# Patient Record
Sex: Male | Born: 1950 | Race: White | Hispanic: No | Marital: Single | Smoking: Never smoker
Health system: Southern US, Community
[De-identification: ages and names within clinical notes are randomized; demographics above are authoritative.]

## PROBLEM LIST (undated history)

## (undated) DIAGNOSIS — K759 Inflammatory liver disease, unspecified: Secondary | ICD-10-CM

## (undated) DIAGNOSIS — E785 Hyperlipidemia, unspecified: Secondary | ICD-10-CM

## (undated) DIAGNOSIS — N200 Calculus of kidney: Secondary | ICD-10-CM

## (undated) DIAGNOSIS — K219 Gastro-esophageal reflux disease without esophagitis: Secondary | ICD-10-CM

## (undated) DIAGNOSIS — K8020 Calculus of gallbladder without cholecystitis without obstruction: Secondary | ICD-10-CM

## (undated) DIAGNOSIS — N400 Enlarged prostate without lower urinary tract symptoms: Secondary | ICD-10-CM

## (undated) DIAGNOSIS — I442 Atrioventricular block, complete: Secondary | ICD-10-CM

## (undated) DIAGNOSIS — I480 Paroxysmal atrial fibrillation: Secondary | ICD-10-CM

## (undated) HISTORY — DX: Calculus of kidney: N20.0

## (undated) HISTORY — PX: LIVER BIOPSY: SHX301

## (undated) HISTORY — DX: Calculus of gallbladder without cholecystitis without obstruction: K80.20

## (undated) HISTORY — DX: Gastro-esophageal reflux disease without esophagitis: K21.9

## (undated) HISTORY — DX: Inflammatory liver disease, unspecified: K75.9

## (undated) HISTORY — PX: WISDOM TOOTH EXTRACTION: SHX21

## (undated) HISTORY — DX: Paroxysmal atrial fibrillation: I48.0

## (undated) HISTORY — DX: Hyperlipidemia, unspecified: E78.5

## (undated) HISTORY — PX: COLONOSCOPY: SHX174

## (undated) HISTORY — DX: Benign prostatic hyperplasia without lower urinary tract symptoms: N40.0

---

## 2010-01-24 ENCOUNTER — Ambulatory Visit (HOSPITAL_COMMUNITY): Admission: RE | Admit: 2010-01-24 | Discharge: 2010-01-24 | Payer: Self-pay | Admitting: Gastroenterology

## 2011-03-15 LAB — CBC
HCT: 45.6 % (ref 39.0–52.0)
Hemoglobin: 15.6 g/dL (ref 13.0–17.0)
WBC: 3 10*3/uL — ABNORMAL LOW (ref 4.0–10.5)

## 2012-10-02 ENCOUNTER — Other Ambulatory Visit: Payer: Self-pay | Admitting: Family Medicine

## 2012-10-02 DIAGNOSIS — H532 Diplopia: Secondary | ICD-10-CM

## 2012-10-03 ENCOUNTER — Ambulatory Visit
Admission: RE | Admit: 2012-10-03 | Discharge: 2012-10-03 | Disposition: A | Discharge status: Home / Self Care | Payer: BC Managed Care – PPO | Source: Ambulatory Visit | Attending: Family Medicine | Admitting: Family Medicine

## 2012-10-03 DIAGNOSIS — H532 Diplopia: Secondary | ICD-10-CM

## 2012-10-03 MED ORDER — GADOBENATE DIMEGLUMINE 529 MG/ML IV SOLN
19.0000 mL | Freq: Once | INTRAVENOUS | Status: AC | PRN
  Administered 2012-10-03: 19 mL via INTRAVENOUS

## 2014-06-14 ENCOUNTER — Ambulatory Visit
Admission: RE | Admit: 2014-06-14 | Discharge: 2014-06-14 | Disposition: A | Discharge status: Home / Self Care | Payer: BC Managed Care – PPO | Source: Ambulatory Visit | Attending: Family Medicine | Admitting: Family Medicine

## 2014-06-14 ENCOUNTER — Other Ambulatory Visit: Payer: Self-pay | Admitting: Family Medicine

## 2014-06-14 DIAGNOSIS — M545 Low back pain: Secondary | ICD-10-CM

## 2014-10-22 ENCOUNTER — Encounter: Payer: Self-pay | Admitting: Neurology

## 2014-10-22 ENCOUNTER — Ambulatory Visit (INDEPENDENT_AMBULATORY_CARE_PROVIDER_SITE_OTHER): Payer: BC Managed Care – PPO | Admitting: Neurology

## 2014-10-22 VITALS — BP 158/94 | HR 61 | Ht 71.0 in | Wt 196.5 lb

## 2014-10-22 DIAGNOSIS — H532 Diplopia: Secondary | ICD-10-CM

## 2014-10-22 DIAGNOSIS — G453 Amaurosis fugax: Secondary | ICD-10-CM

## 2014-10-22 DIAGNOSIS — H53133 Sudden visual loss, bilateral: Secondary | ICD-10-CM

## 2014-10-22 NOTE — Progress Notes (Signed)
[facility]    Provider:  Dr Jaynee Eagles Referring Provider: Hulan Fess, MD Primary Care Physician:  Gennette Pac, MD  CC:  Episodic blurry vision  HPI:  Frank Holloway is a 63 y.o. male here as a referral from Dr. Rex Kras for Vision Changes. Patient complains of acute vision changes that last last for a few minutes. Notes show that he had acute diplopia in 2013 and had an MRI of the brain at that time unfortunately MRI results are not available. Patient says MRI of the brain and cartotid arteries were normal at that . He reports bilateral wavy lines like he is looking through water and partially obscured vision above the middle horizontal line. lasted for 3 minutes. No headaches. No focal neurologic symptoms. He has migrating muscle pain, sharp but not debilitating for a second every once in a while for 10-15 minutes like a knot in the muscle. Vision changes happened twice in 48 hours. No confusion, no LOC, no CP or SOB. No problems chewing or pain in the temples. Right now back to baseline and vision is fine. No eye pain. No jaw claudication. No neck or back pain or stiffness.    Referring physician notes states patient with blurred vision/wavy lines.   Review of Systems: Patient complains of symptoms per HPI as well as the following symptoms: blurred vision and birth marks. Pertinent negatives per HPI. All others negative.   History   Social History  . Marital Status: Single    Spouse Name: N/A    Number of Children: N/A  . Years of Education: N/A   Occupational History  . car Designer, fashion/clothing in [place] Topics  . Smoking status: Never Smoker   . Smokeless tobacco: Never Used  . Alcohol Use: Yes     Comment: beer: 4 servings per week  . Drug Use: No  . Sexual Activity: Not on file   Other Topics Concern  . Not on file   Social History Narrative   Caffeine Use: daily    Family History  Problem Relation Age of Onset  .  Cirrhosis Father   . Breast cancer Mother   . Leukemia    . Heart attack    . Sarcoidosis    . Asthma Maternal Grandmother   . Sarcoidosis Sister     Past Medical History  Diagnosis Date  . GERD (gastroesophageal reflux disease)   . Gallstones   . Kidney stones   . Hepatitis     probable autoimmune  . Hyperlipidemia   . BPH (benign prostatic hyperplasia)     Past Surgical History  Procedure Laterality Date  . Wisdom tooth extraction    . Colonoscopy  2001, 2003  . Liver biopsy      Current Outpatient Prescriptions  Medication Sig Dispense Refill  . azaTHIOprine (IMURAN) 50 MG tablet Take 1 tablet by mouth daily.      . Calcium-Magnesium-Vitamin D (CALCIUM MAGNESIUM PO) Take 1 tablet by mouth daily.      . Multiple Vitamins-Minerals (MULTIVITAMIN PO) Take 1 tablet by mouth daily.      . pravastatin (PRAVACHOL) 10 MG tablet Take 1 tablet by mouth daily.       No current facility-administered medications for this visit.    Allergies as of 10/22/2014 - Review Complete 10/22/2014  Allergen Reaction Noted  . Lipitor [atorvastatin]  10/22/2014    Vitals: BP 158/94  Pulse 61  Ht 5' 11"  (1.803  m)  Wt 196 lb 8 oz (89.132 kg)  BMI 27.42 kg/m2 Last Weight:  Wt Readings from Last 1 Encounters:  10/22/14 196 lb 8 oz (89.132 kg)   Last Height:   Ht Readings from Last 1 Encounters:  10/22/14 5' 11"  (1.803 m)    Physical exam: Exam: Gen: NAD, conversant, well nourised, well groomed                     CV: RRR, no MRG. No Carotid Bruits. No peripheral edema, warm, nontender Eyes: Conjunctivae clear without exudates or hemorrhage  Neuro: Detailed Neurologic Exam  Speech:    Speech is normal; fluent and spontaneous with normal comprehension.  Cognition:    The patient is oriented to person, place, and time;     recent and remote memory intact;     language fluent;     normal attention, concentration,     fund of knowledge Cranial Nerves:    The pupils are equal,  round, and reactive to light. The fundi are normal and spontaneous venous pulsations are present. Visual fields are full to finger confrontation. Extraocular movements are intact. Trigeminal sensation is intact and the muscles of mastication are normal. The face is symmetric. The palate elevates in the midline. Voice is normal. Shoulder shrug is normal. The tongue has normal motion without fasciculations.   Coordination:    Normal finger to nose and heel to shin. Normal rapid alternating movements.   Gait:    Heel-toe and tandem gait are normal.   Motor Observation:    No asymmetry, no atrophy, and no involuntary movements noted. Tone:    Normal muscle tone.    Posture:    Posture is normal. normal erect    Strength:    Strength is V/V in the upper and lower limbs.      Sensation: intact     Reflex Exam:  DTR's:    Deep tendon reflexes in the upper and lower extremities are normal bilaterally.   Toes:    The toes are downgoing bilaterally.   Clonus:    Clonus is absent.      Assessment/Plan:  Patient with acute onset vision changes in the upper hemisphere of his vision. Transient binocular visual loss suggests a process involving the optic chiasm, tracts, or radiations, or the visual cortex. Need an MRI of the brain with/without contrast and an MRI of the orbits w/wo contrast. Also carotid dopplers to eval for carotid occlusive disease, as well as esr/crp to evaluate for temporal arteritis. Neuro exam non focal. Continue statin and asa for stroke prophylaxis. If workup is negative can consider migraine aura without the headache.    Sarina Ill, MD  [facility] [address] [place], [place] [address]  Phone [phone] Fax [phone]

## 2014-10-22 NOTE — Patient Instructions (Signed)
Overall you are doing fairly well but I do want to suggest a few things today:   Remember to drink plenty of fluid, eat healthy meals and do not skip any meals. Try to eat protein with a every meal and eat a healthy snack such as fruit or nuts in between meals. Try to keep a regular sleep-wake schedule and try to exercise daily, particularly in the form of walking, 20-30 minutes a day, if you can.   As far as diagnostic testing: MRI of the brain and orbits, carotid dopplers  I would like to see you back in 3 months, sooner if we need to. Please call us with any interim questions, concerns, problems, updates or refill requests.   Please also call us for any test results so we can go over those with you on the phone.  My clinical assistant and will answer any of your questions and relay your messages to me and also relay most of my messages to you.   Our phone number is [phone]. We also have an after hours call service for urgent matters and there is a physician on-call for urgent questions. For any emergencies you know to call 911 or go to the nearest emergency room

## 2014-10-23 LAB — BASIC METABOLIC PANEL
BUN / CREAT RATIO: 15 (ref 10–22)
BUN: 15 mg/dL (ref 8–27)
CALCIUM: 9.5 mg/dL (ref 8.6–10.2)
CHLORIDE: 102 mmol/L (ref 97–108)
CO2: 27 mmol/L (ref 18–29)
Creatinine, Ser: 1.01 mg/dL (ref 0.76–1.27)
GFR calc non Af Amer: 79 mL/min/{1.73_m2} (ref >59)
GFR, EST AFRICAN AMERICAN: 91 mL/min/{1.73_m2} (ref >59)
Glucose: 93 mg/dL (ref 65–99)
POTASSIUM: 4.7 mmol/L (ref 3.5–5.2)
Sodium: 143 mmol/L (ref 134–144)

## 2014-10-23 LAB — SEDIMENTATION RATE: SED RATE: 2 mm/h (ref 0–30)

## 2014-10-23 LAB — C-REACTIVE PROTEIN: CRP: 0.4 mg/L (ref 0.0–4.9)

## 2014-10-27 ENCOUNTER — Telehealth: Payer: Self-pay

## 2014-10-27 NOTE — Telephone Encounter (Signed)
-----   Message from Anson FretAntonia B Ahern, MD sent at 10/25/2014 11:20 AM EST ----- Please let patient know that his labs were normal. Thank you

## 2014-10-27 NOTE — Telephone Encounter (Signed)
Called patient. Gave results

## 2014-11-01 ENCOUNTER — Telehealth: Payer: Self-pay | Admitting: Radiology

## 2014-11-21 ENCOUNTER — Encounter: Payer: Self-pay | Admitting: *Deleted

## 2015-03-23 ENCOUNTER — Other Ambulatory Visit: Payer: Self-pay | Admitting: Family Medicine

## 2015-03-23 DIAGNOSIS — I739 Peripheral vascular disease, unspecified: Principal | ICD-10-CM

## 2015-03-23 DIAGNOSIS — I779 Disorder of arteries and arterioles, unspecified: Secondary | ICD-10-CM

## 2015-04-05 ENCOUNTER — Ambulatory Visit
Admission: RE | Admit: 2015-04-05 | Discharge: 2015-04-05 | Disposition: A | Discharge status: Home / Self Care | Payer: Self-pay | Source: Ambulatory Visit | Attending: Family Medicine | Admitting: Family Medicine

## 2015-04-05 DIAGNOSIS — I779 Disorder of arteries and arterioles, unspecified: Secondary | ICD-10-CM

## 2015-04-05 DIAGNOSIS — I739 Peripheral vascular disease, unspecified: Principal | ICD-10-CM

## 2017-04-23 ENCOUNTER — Emergency Department (HOSPITAL_COMMUNITY): Payer: BLUE CROSS/BLUE SHIELD

## 2017-04-23 ENCOUNTER — Encounter (HOSPITAL_COMMUNITY): Payer: Self-pay | Admitting: Emergency Medicine

## 2017-04-23 ENCOUNTER — Inpatient Hospital Stay (HOSPITAL_COMMUNITY)
Admission: EM | Admit: 2017-04-23 | Discharge: 2017-04-26 | DRG: 243 | Disposition: A | Discharge status: Home / Self Care | Payer: BLUE CROSS/BLUE SHIELD | Attending: Cardiovascular Disease | Admitting: Cardiovascular Disease

## 2017-04-23 DIAGNOSIS — I2510 Atherosclerotic heart disease of native coronary artery without angina pectoris: Secondary | ICD-10-CM | POA: Diagnosis present

## 2017-04-23 DIAGNOSIS — K754 Autoimmune hepatitis: Secondary | ICD-10-CM | POA: Diagnosis present

## 2017-04-23 DIAGNOSIS — I214 Non-ST elevation (NSTEMI) myocardial infarction: Secondary | ICD-10-CM | POA: Diagnosis not present

## 2017-04-23 DIAGNOSIS — I441 Atrioventricular block, second degree: Secondary | ICD-10-CM

## 2017-04-23 DIAGNOSIS — Z806 Family history of leukemia: Secondary | ICD-10-CM | POA: Diagnosis not present

## 2017-04-23 DIAGNOSIS — E785 Hyperlipidemia, unspecified: Secondary | ICD-10-CM | POA: Diagnosis present

## 2017-04-23 DIAGNOSIS — K219 Gastro-esophageal reflux disease without esophagitis: Secondary | ICD-10-CM | POA: Diagnosis present

## 2017-04-23 DIAGNOSIS — I10 Essential (primary) hypertension: Secondary | ICD-10-CM | POA: Insufficient documentation

## 2017-04-23 DIAGNOSIS — R55 Syncope and collapse: Secondary | ICD-10-CM | POA: Diagnosis present

## 2017-04-23 DIAGNOSIS — R7989 Other specified abnormal findings of blood chemistry: Secondary | ICD-10-CM

## 2017-04-23 DIAGNOSIS — Z8249 Family history of ischemic heart disease and other diseases of the circulatory system: Secondary | ICD-10-CM | POA: Diagnosis not present

## 2017-04-23 DIAGNOSIS — I447 Left bundle-branch block, unspecified: Secondary | ICD-10-CM | POA: Diagnosis present

## 2017-04-23 DIAGNOSIS — I442 Atrioventricular block, complete: Secondary | ICD-10-CM | POA: Diagnosis not present

## 2017-04-23 DIAGNOSIS — Z87898 Personal history of other specified conditions: Secondary | ICD-10-CM

## 2017-04-23 DIAGNOSIS — I4892 Unspecified atrial flutter: Secondary | ICD-10-CM | POA: Diagnosis not present

## 2017-04-23 DIAGNOSIS — Z959 Presence of cardiac and vascular implant and graft, unspecified: Secondary | ICD-10-CM

## 2017-04-23 DIAGNOSIS — N400 Enlarged prostate without lower urinary tract symptoms: Secondary | ICD-10-CM | POA: Diagnosis present

## 2017-04-23 DIAGNOSIS — I21A1 Myocardial infarction type 2: Secondary | ICD-10-CM | POA: Diagnosis present

## 2017-04-23 DIAGNOSIS — R778 Other specified abnormalities of plasma proteins: Secondary | ICD-10-CM

## 2017-04-23 HISTORY — DX: Atrioventricular block, complete: I44.2

## 2017-04-23 LAB — I-STAT TROPONIN, ED
Troponin i, poc: 0.02 ng/mL (ref 0.00–0.08)
Troponin i, poc: 0.17 ng/mL (ref 0.00–0.08)

## 2017-04-23 LAB — BASIC METABOLIC PANEL
ANION GAP: 8 (ref 5–15)
BUN: 15 mg/dL (ref 6–20)
CHLORIDE: 105 mmol/L (ref 101–111)
CO2: 26 mmol/L (ref 22–32)
Calcium: 9.6 mg/dL (ref 8.9–10.3)
Creatinine, Ser: 0.91 mg/dL (ref 0.61–1.24)
GFR calc Af Amer: >60 mL/min (ref >60)
GLUCOSE: 106 mg/dL — AB (ref 65–99)
POTASSIUM: 4.2 mmol/L (ref 3.5–5.1)
SODIUM: 139 mmol/L (ref 135–145)

## 2017-04-23 LAB — TROPONIN I: Troponin I: 0.25 ng/mL (ref <0.03)

## 2017-04-23 LAB — CBC
HEMATOCRIT: 43.5 % (ref 39.0–52.0)
HEMOGLOBIN: 14.7 g/dL (ref 13.0–17.0)
MCH: 31.3 pg (ref 26.0–34.0)
MCHC: 33.8 g/dL (ref 30.0–36.0)
MCV: 92.6 fL (ref 78.0–100.0)
Platelets: 148 10*3/uL — ABNORMAL LOW (ref 150–400)
RBC: 4.7 MIL/uL (ref 4.22–5.81)
RDW: 12.7 % (ref 11.5–15.5)
WBC: 4.2 10*3/uL (ref 4.0–10.5)

## 2017-04-23 LAB — MAGNESIUM: Magnesium: 2.1 mg/dL (ref 1.7–2.4)

## 2017-04-23 LAB — TSH: TSH: 1.037 u[IU]/mL (ref 0.350–4.500)

## 2017-04-23 LAB — BRAIN NATRIURETIC PEPTIDE: B NATRIURETIC PEPTIDE 5: 17.7 pg/mL (ref 0.0–100.0)

## 2017-04-23 LAB — HEPARIN LEVEL (UNFRACTIONATED): Heparin Unfractionated: 0.4 IU/mL (ref 0.30–0.70)

## 2017-04-23 MED ORDER — MULTIVITAMIN PO LIQD: Freq: Every day | ORAL | Status: DC

## 2017-04-23 MED ORDER — PRAVASTATIN SODIUM 20 MG PO TABS
10.0000 mg | ORAL_TABLET | Freq: Every day | ORAL | Status: DC
  Administered 2017-04-24 – 2017-04-26 (×3): 10 mg via ORAL
  Filled 2017-04-23 (×3): qty 1

## 2017-04-23 MED ORDER — ACETAMINOPHEN 325 MG PO TABS: 650.0000 mg | ORAL_TABLET | ORAL | Status: DC | PRN

## 2017-04-23 MED ORDER — ALPRAZOLAM 0.25 MG PO TABS: 0.2500 mg | ORAL_TABLET | Freq: Two times a day (BID) | ORAL | Status: DC | PRN

## 2017-04-23 MED ORDER — SODIUM CHLORIDE 0.9 % IV SOLN: 250.0000 mL | INTRAVENOUS | Status: DC | PRN

## 2017-04-23 MED ORDER — HEPARIN (PORCINE) IN NACL 100-0.45 UNIT/ML-% IJ SOLN
1100.0000 [IU]/h | INTRAMUSCULAR | Status: DC
  Administered 2017-04-23: 1550 [IU]/h via INTRAVENOUS
  Filled 2017-04-23: qty 250

## 2017-04-23 MED ORDER — AZATHIOPRINE 50 MG PO TABS
50.0000 mg | ORAL_TABLET | Freq: Every day | ORAL | Status: DC
  Administered 2017-04-24 – 2017-04-26 (×3): 50 mg via ORAL
  Filled 2017-04-23 (×3): qty 1

## 2017-04-23 MED ORDER — ADULT MULTIVITAMIN LIQUID CH
15.0000 mL | Freq: Every day | ORAL | Status: DC
  Administered 2017-04-24 – 2017-04-25 (×2): 15 mL via ORAL
  Filled 2017-04-23 (×2): qty 15

## 2017-04-23 MED ORDER — SODIUM CHLORIDE 0.9% FLUSH: 3.0000 mL | INTRAVENOUS | Status: DC | PRN

## 2017-04-23 MED ORDER — NITROGLYCERIN 0.4 MG SL SUBL: 0.4000 mg | SUBLINGUAL_TABLET | SUBLINGUAL | Status: DC | PRN

## 2017-04-23 MED ORDER — IOPAMIDOL (ISOVUE-370) INJECTION 76%
INTRAVENOUS | Status: AC
  Administered 2017-04-23: 100 mL
  Filled 2017-04-23: qty 100

## 2017-04-23 MED ORDER — ASPIRIN EC 81 MG PO TBEC
81.0000 mg | DELAYED_RELEASE_TABLET | Freq: Every day | ORAL | Status: DC
  Administered 2017-04-24: 81 mg via ORAL
  Filled 2017-04-23: qty 1

## 2017-04-23 MED ORDER — HEPARIN BOLUS VIA INFUSION
4000.0000 [IU] | Freq: Once | INTRAVENOUS | Status: DC
  Filled 2017-04-23: qty 4000

## 2017-04-23 MED ORDER — CARVEDILOL 3.125 MG PO TABS
3.1250 mg | ORAL_TABLET | Freq: Two times a day (BID) | ORAL | Status: DC
  Administered 2017-04-23 – 2017-04-24 (×2): 3.125 mg via ORAL
  Filled 2017-04-23 (×3): qty 1

## 2017-04-23 MED ORDER — ONDANSETRON HCL 4 MG/2ML IJ SOLN: 4.0000 mg | Freq: Four times a day (QID) | INTRAMUSCULAR | Status: DC | PRN

## 2017-04-23 MED ORDER — HEPARIN BOLUS VIA INFUSION
5000.0000 [IU] | Freq: Once | INTRAVENOUS | Status: AC
  Administered 2017-04-23: 5000 [IU] via INTRAVENOUS
  Filled 2017-04-23: qty 5000

## 2017-04-23 MED ORDER — SODIUM CHLORIDE 0.9 % WEIGHT BASED INFUSION
1.0000 mL/kg/h | INTRAVENOUS | Status: DC
  Administered 2017-04-23: 1 mL/kg/h via INTRAVENOUS

## 2017-04-23 MED ORDER — ASPIRIN 81 MG PO CHEW
324.0000 mg | CHEWABLE_TABLET | ORAL | Status: AC
  Administered 2017-04-23: 324 mg via ORAL
  Filled 2017-04-23: qty 4

## 2017-04-23 MED ORDER — ASPIRIN 300 MG RE SUPP: 300.0000 mg | RECTAL | Status: DC

## 2017-04-23 MED ORDER — ZOLPIDEM TARTRATE 5 MG PO TABS: 5.0000 mg | ORAL_TABLET | Freq: Every evening | ORAL | Status: DC | PRN

## 2017-04-23 NOTE — ED Notes (Signed)
Admitting provider at bedside.

## 2017-04-23 NOTE — ED Triage Notes (Signed)
Pt coming from work in [place] via [facility], after a unwitnessed fall. Patient was seen normal this morning and then found on the floor unresponsive. When EMS arrived patient was confused and couldn't tell what happened or who the president was. Stroke scale initially with EMS was 0. After EMS arrival patient began to become alert. Patient is alert and oriented x4 on arrival to this ED. Patient has posterior hematoma from fall. EMS reports left bbb, and pvcs.

## 2017-04-23 NOTE — ED Notes (Signed)
Urine sample obtained.  Order then cancelled by EDP.

## 2017-04-23 NOTE — H&P (Signed)
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 He ios followed by Dr Wyline Beady. He has no history of CAD or prior cardiac issues. He had been going to the gym regularly til 4-5 months ago, he stopped because of other commitments. Today he was getting ready for work. He had been sitting for less than a minute. Got up and the next thing he knew EMTs were there. He had no pre syncopal warning- no palpitations, no nausea, no dizziness. He does admit to some recent exertional SOB and chest pressure when carrying groceries for the past 3-4 weeks. In the ED his EKG shows NSR, LBBB, PVC. Troponin #1 negative, #2 - 0.17. Chest CT shows Ca++ coronaries. ,   Problem List: Past Medical History:  Diagnosis Date  . BPH (benign prostatic hyperplasia)   . Gallstones   . GERD (gastroesophageal reflux disease)   . 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 Hyperlipidemia   . Kidney stones     Past Surgical History:  Procedure Laterality Date  . COLONOSCOPY  2001, 2003  . LIVER BIOPSY    . WISDOM TOOTH EXTRACTION       Allergies:  Allergies  Allergen Reactions  . Lipitor [Atorvastatin]     Elevated liver function     Home Medications  Past Medical History:  Diagnosis Date  . BPH (benign prostatic hyperplasia)   . Gallstones   . GERD (gastroesophageal reflux disease)   . 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 Hyperlipidemia   . Kidney stones     Prior to Admission medications   Medication Sig Start Date End Date Taking? Authorizing Provider  azaTHIOprine (IMURAN) 50 MG tablet Take 1 tablet by mouth daily. 09/28/14  Yes Historical Provider, MD  Calcium-Magnesium-Vitamin D (CALCIUM MAGNESIUM PO) Take 1 tablet by mouth daily.   Yes Historical Provider, MD  milk thistle 175 MG tablet Take 175 mg by mouth  daily.   Yes Historical Provider, MD  Multiple Vitamins-Minerals (MULTIVITAMIN PO) Take 1 tablet by mouth daily.   Yes Historical Provider, MD  pravastatin (PRAVACHOL) 10 MG tablet Take 1 tablet by mouth daily. 10/13/14  Yes Historical Provider, MD     Family History  Problem Relation Age of Onset  . Cirrhosis Father   . Breast cancer Mother   . Sarcoidosis Sister   . Leukemia    . Heart attack    . Sarcoidosis    . Asthma Maternal Grandmother      Social History   Social History  . Marital status: Single    Spouse name: N/A  . Number of children: N/A  . Years of education: N/A   Occupational History  . 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 Smoking status: Never Smoker  . Smokeless tobacco: Never Used  . Alcohol use Yes     Comment: beer: 4 servings per week  . Drug use: No  . Sexual activity: Not on file   Other Topics Concern  . Not on file   Social History Narrative   Caffeine Use: daily     Review of Systems: General: negative for chills, fever, night sweats or weight changes.  Cardiovascular: negative for edema, orthopnea, palpitations, paroxysmal nocturnal dyspnea or shortness  of breath HEENT: negative for any visual disturbances, blindness, glaucoma Dermatological: negative for rash Respiratory: negative for cough, hemoptysis, or wheezing Urologic: negative for hematuria or dysuria Abdominal: negative for nausea, vomiting, diarrhea, bright red blood per rectum, melena, or hematemesis Neurologic: negative for visual changes, syncope, or dizziness Musculoskeletal: negative for back pain, joint pain, or swelling Psych: cooperative and appropriate Minor carotid disease on dopplers 2016, negative MRI in 2013 All other systems reviewed and are otherwise negative except as noted above.  Physical Exam: Blood pressure (!) 152/102, pulse 74, temperature 98.6 F (37 C), temperature source Oral,  resp. rate 15, height  (1.803 m), weight 203 lb (92.1 kg), SpO2 97 %.  General appearance: alert, cooperative and no distress Neck: no carotid bruit and no JVD Lungs: clear to auscultation bilaterally Heart: regular rate and rhythm Abdomen: soft, non-tender; bowel sounds normal; no masses,  no organomegaly Extremities: extremities normal, atraumatic, no cyanosis or edema Pulses: 2+ and symmetric Skin: Skin color, texture, turgor normal. No rashes or lesions Neurologic: Grossly normal    Labs:   Results for orders placed or performed during the hospital encounter of 04/23/17 (from the past 24 hour(s))  Basic metabolic panel     Status: Abnormal   Collection Time: 04/23/17 12:04 PM  Result Value Ref Range   Sodium 139 135 - 145 mmol/L   Potassium 4.2 3.5 - 5.1 mmol/L   Chloride 105 101 - 111 mmol/L   CO2 26 22 - 32 mmol/L   Glucose, Bld 106 (H) 65 - 99 mg/dL   BUN 15 6 - 20 mg/dL   Creatinine, Ser 1.61 0.61 - 1.24 mg/dL   Calcium 9.6 8.9 - 09.6 mg/dL   GFR calc non Af Amer >60 >60 mL/min   GFR calc Af Amer >60 >60 mL/min   Anion gap 8 5 - 15  CBC     Status: Abnormal   Collection Time: 04/23/17 12:04 PM  Result Value Ref Range   WBC 4.2 4.0 - 10.5 K/uL   RBC 4.70 4.22 - 5.81 MIL/uL   Hemoglobin 14.7 13.0 - 17.0 g/dL   HCT 04.5 40.9 - 81.1 %   MCV 92.6 78.0 - 100.0 fL   MCH 31.3 26.0 - 34.0 pg   MCHC 33.8 30.0 - 36.0 g/dL   RDW 91.4 78.2 - 95.6 %   Platelets 148 (L) 150 - 400 K/uL  Brain natriuretic peptide     Status: None   Collection Time: 04/23/17 12:04 PM  Result Value Ref Range   B Natriuretic Peptide 17.7 0.0 - 100.0 pg/mL  I-Stat Troponin, ED (not at [facility])     Status: None   Collection Time: 04/23/17 12:57 PM  Result Value Ref Range   Troponin i, poc 0.02 0.00 - 0.08 ng/mL   Comment 3          I-Stat Troponin, ED (not at [facility])     Status: Abnormal   Collection Time: 04/23/17  3:53 PM  Result Value Ref Range   Troponin i, poc 0.17 (HH) 0.00 - 0.08 ng/mL     Comment NOTIFIED PHYSICIAN    Comment 3             Radiology/Studies: Ct Head Wo Contrast  Result Date: 04/23/2017 CLINICAL DATA:  Unwitnessed fall.  Initial encounter. EXAM: CT HEAD WITHOUT CONTRAST TECHNIQUE: Contiguous axial images were obtained from the base of the skull through the vertex without intravenous contrast. COMPARISON:  None. FINDINGS: Brain: No swelling or acute  hemorrhage suspected. Minimal thickened appearance of the inferior septum pellucidum on 3:16 is suspected volume averaging. No discrete hemorrhage at this site on reformats. No infarct, hydrocephalus, or mass. Vascular: Negative Skull: Scalp contusion at the vertex. Negative for underlying fracture Sinuses/Orbits: Mild mucosal thickening in the paranasal sinuses. No hemosinus. IMPRESSION: Scalp contusion at the vertex without fracture. No acute intracranial finding. Electronically Signed   By: Marnee Spring M.D.   On: 04/23/2017 14:27   Ct Angio Chest Pe W And/or Wo Contrast  Result Date: 04/23/2017 CLINICAL DATA:  Syncopal episode today at work. EXAM: CT ANGIOGRAPHY CHEST WITH CONTRAST TECHNIQUE: Multidetector CT imaging of the chest was performed using the standard protocol during bolus administration of intravenous contrast. Multiplanar CT image reconstructions and MIPs were obtained to evaluate the vascular anatomy. CONTRAST:  100 cc of Isovue 370 COMPARISON:  Chest radiograph 02/07/2011. FINDINGS: Cardiovascular: The quality of this exam for evaluation of pulmonary embolism is good. No evidence of pulmonary embolism. Aortic atherosclerosis. Mild cardiomegaly. Multivessel coronary artery atherosclerosis. Mediastinum/Nodes: No mediastinal or hilar adenopathy. Lungs/Pleura: No pleural fluid.  Mild lingular volume loss. Upper Abdomen: Gallstones of up to 13 mm, without surrounding inflammation. Normal imaged portions of the liver, spleen, stomach, pancreas, adrenal glands, kidneys. Musculoskeletal: Hypoattenuating upper  thoracic vertebral body lesions are likely hemangiomas and are tiny. Largest is in the T6 vertebral body, 8 mm. Review of the MIP images confirms the above findings. IMPRESSION: 1.  No evidence of pulmonary embolism. 2.  Coronary artery atherosclerosis. Aortic atherosclerosis. 3. Cholelithiasis. Electronically Signed   By: Jeronimo Greaves M.D.   On: 04/23/2017 17:03    EKG:NSR, LBBB, PVC  ASSESSMENT AND PLAN:  Principal Problem:   Syncope and collapse Active Problems:   History of exertional chest pain   LBBB (left bundle branch block)   Autoimmune hepatitis (HCC)   PLAN: Admit, r/o MI. He probably needs cath in AM. Start Heparin, ASA, and Coreg-hold off on statin with autoimmune hepatitis.    Jolene Provost, PA-C 04/23/2017, 5:42 PM [phone]

## 2017-04-23 NOTE — ED Notes (Signed)
Patient transported to CT 

## 2017-04-23 NOTE — Progress Notes (Addendum)
ANTICOAGULATION CONSULT NOTE - Initial Consult  Pharmacy Consult for heparin Indication: PE  Allergies  Allergen Reactions  . Lipitor [Atorvastatin]     Elevated liver function    Patient Measurements: Height:  (180.3 cm) Weight: 203 lb (92.1 kg) IBW/kg (Calculated) : 75.3 Heparin Dosing Weight: 92.1kg  Vital Signs: Temp: 98.6 F (37 C) (05/01 1202) Temp Source: Oral (05/01 1202) BP: 152/102 (05/01 1530) Pulse Rate: 74 (05/01 1530)  Labs:  Recent Labs  04/23/17 1204  HGB 14.7  HCT 43.5  PLT 148*  CREATININE 0.91    Estimated Creatinine Clearance: 92.6 mL/min (by C-G formula based on SCr of 0.91 mg/dL).   Medical History: Past Medical History:  Diagnosis Date  . BPH (benign prostatic hyperplasia)   . Gallstones   . GERD (gastroesophageal reflux disease)   . 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 Hyperlipidemia   . Kidney stones     Assessment: 66yo M admitted with unwitnessed fall. Pharmacy has been consulted to dose heparin for concern for PE. No PTA anticoagulation, Hgb 14.7, Plt 148. Of note, patient with posterior hematoma from fall.  Goal of Therapy:  Heparin level 0.3-0.7 units/ml Monitor platelets by anticoagulation protocol: Yes   Plan:  Give 5000 units bolus x 1 Start heparin infusion at 1550 units/hr Check anti-Xa level in 6 hours and daily while on heparin Continue to monitor H&H and platelets    Mackie Pai, PharmD PGY1 Pharmacy Resident Pager: [phone] 04/23/2017 4:29 PM   Addendum: CT scan reported as negative for PE. Will reduce heparin gtt rate to ACS dosing at 1100 units/hr  Lysle Pearl, PharmD, BCPS Pager # [phone] 04/23/2017 5:12 PM

## 2017-04-23 NOTE — ED Notes (Signed)
Ice pack applied to back of head. Pt provided with [place] sandwich and water

## 2017-04-23 NOTE — ED Provider Notes (Addendum)
[place] DEPT Provider Note   CSN: [id number] Arrival date & time: 04/23/17  1148     History   Chief Complaint Chief Complaint  Patient presents with  . Loss of Consciousness    HPI Frank Holloway is a 66 y.o. male.  The history is provided by the patient.  Loss of Consciousness   This is a new problem. The current episode started 1 to 2 hours ago. Episode frequency: once. The problem has been resolved. Length of episode of loss of consciousness: unknown. The problem is associated with standing up. Associated symptoms include headaches and light-headedness. Pertinent negatives include abdominal pain, bladder incontinence, chest pain, confusion, congestion, diaphoresis, fever, malaise/fatigue, nausea, seizures and vomiting. He has tried nothing for the symptoms. His past medical history is significant for TIA.   Reports mild light headedness this am with standing up. Mild headache today that resolved prior to syncope.  DOE for 1 week while carrying groceries; otherwise no SOB.     Past Medical History:  Diagnosis Date  . BPH (benign prostatic hyperplasia)   . Gallstones   . GERD (gastroesophageal reflux disease)   . 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 Hyperlipidemia   . Kidney stones     Patient Active Problem List   Diagnosis Date Noted  . 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 COLONOSCOPY  2001, 2003  . LIVER BIOPSY    . WISDOM TOOTH EXTRACTION         Home Medications    Prior to Admission medications   Medication Sig Start Date End Date Taking? Authorizing Provider  azaTHIOprine (IMURAN) 50 MG tablet Take 1 tablet by mouth daily. 09/28/14  Yes Historical Provider, MD  Calcium-Magnesium-Vitamin D (CALCIUM MAGNESIUM PO) Take 1 tablet by mouth daily.   Yes Historical Provider, MD  milk thistle 175 MG tablet Take 175 mg by mouth daily.   Yes Historical Provider, MD  Multiple Vitamins-Minerals (MULTIVITAMIN PO)  Take 1 tablet by mouth daily.   Yes Historical Provider, MD  pravastatin (PRAVACHOL) 10 MG tablet Take 1 tablet by mouth daily. 10/13/14  Yes Historical Provider, MD    Family History Family History  Problem Relation Age of Onset  . Cirrhosis Father   . Breast cancer Mother   . Sarcoidosis Sister   . Leukemia    . Heart attack    . Sarcoidosis    . Asthma Maternal Grandmother     Social History Social History  Substance Use Topics  . Smoking status: Never Smoker  . Smokeless tobacco: Never Used  . Alcohol use Yes     Comment: beer: 4 servings per week     Allergies   Lipitor [atorvastatin]   Review of Systems Review of Systems  Constitutional: Negative for diaphoresis, fever and malaise/fatigue.  HENT: Negative for congestion.   Cardiovascular: Positive for syncope. Negative for chest pain.  Gastrointestinal: Negative for abdominal pain, nausea and vomiting.  Genitourinary: Negative for bladder incontinence.  Neurological: Positive for light-headedness and headaches. Negative for seizures.  Psychiatric/Behavioral: Negative for confusion.   All other systems are reviewed and are negative for acute change except as noted in the HPI   Physical Exam Updated Vital Signs BP (!) 170/92 (BP Location: Right Arm)   Pulse 70   Temp 98.6 F (37 C) (Oral)   Resp 16   Ht 5' 11"  (1.803 m)   Wt 203 lb (92.1 kg)   SpO2 100%  BMI 28.31 kg/m   Physical Exam  Constitutional: He is oriented to person, place, and time. He appears well-developed and well-nourished. No distress.  HENT:  Head: Normocephalic and atraumatic.  Nose: Nose normal.  Eyes: Conjunctivae and EOM are normal. Pupils are equal, round, and reactive to light. Right eye exhibits no discharge. Left eye exhibits no discharge. No scleral icterus.  Neck: Normal range of motion. Neck supple.  Cardiovascular: Normal rate and regular rhythm.  Exam reveals no gallop and no friction rub.   No murmur  heard. Pulmonary/Chest: Effort normal and breath sounds normal. No stridor. No respiratory distress. He has no rales.  Abdominal: Soft. He exhibits no distension. There is no tenderness.  Musculoskeletal: He exhibits no edema or tenderness.  Neurological: He is alert and oriented to person, place, and time.  Mental Status: Alert and oriented to person, place, and time. Attention and concentration normal. Speech clear. Recent memory is intact  Cranial Nerves  II Visual Fields: Intact to confrontation. Visual fields intact. III, IV, VI: Pupils equal and reactive to light and near. Full eye movement without nystagmus  V Facial Sensation: Normal. No weakness of masticatory muscles  VII: No facial weakness or asymmetry  VIII Auditory Acuity: Grossly normal  IX/X: The uvula is midline; the palate elevates symmetrically  XI: Normal sternocleidomastoid and trapezius strength  XII: The tongue is midline. No atrophy or fasciculations.   Motor System: Muscle Strength: 5/5 and symmetric in the upper and lower extremities. No pronation or drift.  Muscle Tone: Tone and muscle bulk are normal in the upper and lower extremities.   Reflexes: DTRs: 2+ and symmetrical in all four extremities. Plantar responses are flexor bilaterally.  Coordination: Intact finger-to-nose, heel-to-shin, No tremor.  Sensation: Intact to light touch, and pinprick. Negative Romberg test.  Gait: Routine gait normal    Skin: Skin is warm and dry. No rash noted. He is not diaphoretic. No erythema.  Psychiatric: He has a normal mood and affect.  Vitals reviewed.    ED Treatments / Results  Labs (all labs ordered are listed, but only abnormal results are displayed) Labs Reviewed  BASIC METABOLIC PANEL - Abnormal; Notable for the following:       Result Value   Glucose, Bld 106 (*)    All other components within normal limits  CBC - Abnormal; Notable for the following:    Platelets 148 (*)    All other components within  normal limits  BRAIN NATRIURETIC PEPTIDE  I-STAT TROPOININ, ED  I-STAT TROPOININ, ED    EKG  EKG Interpretation  Date/Time:  Tuesday Apr 23 2017 11:55:14 EDT Ventricular Rate:  68 PR Interval:    QRS Duration: 158 QT Interval:  406 QTC Calculation: 432 R Axis:   -19 Text Interpretation:  Sinus rhythm Ventricular premature complex Left bundle branch block NO STEMI    no Sgarbossa criteria met No old tracing to compare Confirmed by [facility] MD, PEDRO ([id number]) on 04/23/2017 3:43:54 PM       Radiology Ct Head Wo Contrast  Result Date: 04/23/2017 CLINICAL DATA:  Unwitnessed fall.  Initial encounter. EXAM: CT HEAD WITHOUT CONTRAST TECHNIQUE: Contiguous axial images were obtained from the base of the skull through the vertex without intravenous contrast. COMPARISON:  None. FINDINGS: Brain: No swelling or acute hemorrhage suspected. Minimal thickened appearance of the inferior septum pellucidum on 3:16 is suspected volume averaging. No discrete hemorrhage at this site on reformats. No infarct, hydrocephalus, or mass. Vascular: Negative Skull: Scalp contusion at  the vertex. Negative for underlying fracture Sinuses/Orbits: Mild mucosal thickening in the paranasal sinuses. No hemosinus. IMPRESSION: Scalp contusion at the vertex without fracture. No acute intracranial finding. Electronically Signed   By: Monte Fantasia M.D.   On: 04/23/2017 14:27    Procedures Procedures (including critical care time) CRITICAL CARE Performed by: Grayce Sessions Cardama Total critical care time: 45 minutes Critical care time was exclusive of separately billable procedures and treating other patients. Critical care was necessary to treat or prevent imminent or life-threatening deterioration. Critical care was time spent personally by me on the following activities: development of treatment plan with patient and/or surrogate as well as nursing, discussions with consultants, evaluation of patient's response to treatment,  examination of patient, obtaining history from patient or surrogate, ordering and performing treatments and interventions, ordering and review of laboratory studies, ordering and review of radiographic studies, pulse oximetry and re-evaluation of patient's condition.   Medications Ordered in ED Medications - No data to display   Initial Impression / Assessment and Plan / ED Course  I have reviewed the triage vital signs and the nursing notes.  Pertinent labs & imaging results that were available during my care of the patient were reviewed by me and considered in my medical decision making (see chart for details).     Syncope likely secondary to positioning and possibly orthostatic. No associated symptoms prior to or following the syncopal episode. Patient is complaining of occipital scalp pain but no overt headache.   Orthostatics normal.   EKG with left bundle branch block and T-wave changes in inferior leads (no prior EKGs for comparison) however patient is not having any associated chest pain or shortness of breath. Initial troponin negative.   Given the patient's recent dyspnea on exertion, we assess for possible new-onset heart failure. BNP within normal limits. No evidence of volume overload to suggest heart failure.  Other screening labs reassuring. No evidence of anemia. No evidence of electrode derangement.  Will trend trop at 3 hr mark.  1617pm: Second troponin elevated at 0.17. Given the left bundle branch block and T-wave inversions, as a possibility that this is secondary to ACS event.  Further discussion with the patient revealed no risk factors for pulmonary embolism however will obtain a CTA to rule this out.  If the CTA is negative patient should be admitted to the cardiology service for further workup and management.  If CTA is positive patient should be admitted to the medicine service for continued management of pulmonary embolism.  Patient was started on  heparin drip.  CTA negative. Patient admitted to cardiology service  Final Clinical Impressions(s) / ED Diagnoses   Final diagnoses:  Syncope and collapse  Elevated troponin  LBBB (left bundle branch block)      Fatima Blank, MD 04/23/17 1717

## 2017-04-23 NOTE — ED Notes (Signed)
Pt returned from CT °

## 2017-04-24 ENCOUNTER — Encounter (HOSPITAL_COMMUNITY): Payer: Self-pay | Admitting: Cardiovascular Disease

## 2017-04-24 ENCOUNTER — Encounter (HOSPITAL_COMMUNITY)
Admission: EM | Disposition: A | Discharge status: Home / Self Care | Payer: Self-pay | Source: Home / Self Care | Attending: Cardiovascular Disease

## 2017-04-24 DIAGNOSIS — I441 Atrioventricular block, second degree: Secondary | ICD-10-CM

## 2017-04-24 DIAGNOSIS — I442 Atrioventricular block, complete: Secondary | ICD-10-CM

## 2017-04-24 DIAGNOSIS — I2510 Atherosclerotic heart disease of native coronary artery without angina pectoris: Secondary | ICD-10-CM

## 2017-04-24 HISTORY — PX: LEFT HEART CATH AND CORONARY ANGIOGRAPHY: CATH118249

## 2017-04-24 HISTORY — DX: Atrioventricular block, complete: I44.2

## 2017-04-24 LAB — COMPREHENSIVE METABOLIC PANEL
ALT: 87 U/L — ABNORMAL HIGH (ref 17–63)
AST: 55 U/L — ABNORMAL HIGH (ref 15–41)
Albumin: 3.7 g/dL (ref 3.5–5.0)
Alkaline Phosphatase: 42 U/L (ref 38–126)
Anion gap: 8 (ref 5–15)
BUN: 11 mg/dL (ref 6–20)
CO2: 25 mmol/L (ref 22–32)
Calcium: 9.2 mg/dL (ref 8.9–10.3)
Chloride: 106 mmol/L (ref 101–111)
Creatinine, Ser: 0.86 mg/dL (ref 0.61–1.24)
GFR calc Af Amer: >60 mL/min (ref >60)
GFR calc non Af Amer: >60 mL/min (ref >60)
Glucose, Bld: 98 mg/dL (ref 65–99)
Potassium: 4.1 mmol/L (ref 3.5–5.1)
Sodium: 139 mmol/L (ref 135–145)
Total Bilirubin: 1.4 mg/dL — ABNORMAL HIGH (ref 0.3–1.2)
Total Protein: 6.7 g/dL (ref 6.5–8.1)

## 2017-04-24 LAB — CBC
HCT: 43.5 % (ref 39.0–52.0)
Hemoglobin: 15.1 g/dL (ref 13.0–17.0)
MCH: 31.9 pg (ref 26.0–34.0)
MCHC: 34.7 g/dL (ref 30.0–36.0)
MCV: 91.8 fL (ref 78.0–100.0)
PLATELETS: 161 10*3/uL (ref 150–400)
RBC: 4.74 MIL/uL (ref 4.22–5.81)
RDW: 12.5 % (ref 11.5–15.5)
WBC: 5.5 10*3/uL (ref 4.0–10.5)

## 2017-04-24 LAB — LIPID PANEL
Cholesterol: 202 mg/dL — ABNORMAL HIGH (ref 0–200)
HDL: 38 mg/dL — ABNORMAL LOW (ref >40)
LDL Cholesterol: 139 mg/dL — ABNORMAL HIGH (ref 0–99)
Total CHOL/HDL Ratio: 5.3 RATIO
Triglycerides: 123 mg/dL (ref <150)
VLDL: 25 mg/dL (ref 0–40)

## 2017-04-24 LAB — PROTIME-INR
INR: 1.12
PROTHROMBIN TIME: 14.5 s (ref 11.4–15.2)

## 2017-04-24 LAB — TROPONIN I
Troponin I: 0.19 ng/mL (ref <0.03)
Troponin I: 0.11 ng/mL (ref <0.03)

## 2017-04-24 LAB — HEPARIN LEVEL (UNFRACTIONATED): Heparin Unfractionated: 0.29 IU/mL — ABNORMAL LOW (ref 0.30–0.70)

## 2017-04-24 SURGERY — LEFT HEART CATH AND CORONARY ANGIOGRAPHY
Anesthesia: LOCAL

## 2017-04-24 MED ORDER — DIAZEPAM 5 MG PO TABS: 5.0000 mg | ORAL_TABLET | Freq: Four times a day (QID) | ORAL | Status: DC | PRN

## 2017-04-24 MED ORDER — ACETAMINOPHEN 325 MG PO TABS
650.0000 mg | ORAL_TABLET | ORAL | Status: DC | PRN
  Administered 2017-04-25: 650 mg via ORAL
  Filled 2017-04-24: qty 2

## 2017-04-24 MED ORDER — HEPARIN (PORCINE) IN NACL 2-0.9 UNIT/ML-% IJ SOLN
INTRAMUSCULAR | Status: AC
  Filled 2017-04-24: qty 1000

## 2017-04-24 MED ORDER — HEPARIN SODIUM (PORCINE) 1000 UNIT/ML IJ SOLN
INTRAMUSCULAR | Status: AC
  Filled 2017-04-24: qty 1

## 2017-04-24 MED ORDER — SODIUM CHLORIDE 0.9% FLUSH: 3.0000 mL | INTRAVENOUS | Status: DC | PRN

## 2017-04-24 MED ORDER — FENTANYL CITRATE (PF) 100 MCG/2ML IJ SOLN
INTRAMUSCULAR | Status: DC | PRN
  Administered 2017-04-24: 50 ug via INTRAVENOUS

## 2017-04-24 MED ORDER — HEPARIN SODIUM (PORCINE) 1000 UNIT/ML IJ SOLN
INTRAMUSCULAR | Status: DC | PRN
  Administered 2017-04-24: 5000 [IU] via INTRAVENOUS

## 2017-04-24 MED ORDER — SODIUM CHLORIDE 0.9 % IV SOLN: 250.0000 mL | INTRAVENOUS | Status: DC | PRN

## 2017-04-24 MED ORDER — ONDANSETRON HCL 4 MG/2ML IJ SOLN: 4.0000 mg | Freq: Four times a day (QID) | INTRAMUSCULAR | Status: DC | PRN

## 2017-04-24 MED ORDER — HEPARIN (PORCINE) IN NACL 2-0.9 UNIT/ML-% IJ SOLN
INTRAMUSCULAR | Status: DC | PRN
  Administered 2017-04-24: 1000 mL

## 2017-04-24 MED ORDER — ASPIRIN 81 MG PO CHEW
81.0000 mg | CHEWABLE_TABLET | Freq: Every day | ORAL | Status: DC
  Administered 2017-04-25: 81 mg via ORAL
  Filled 2017-04-24: qty 1

## 2017-04-24 MED ORDER — HEPARIN (PORCINE) IN NACL 100-0.45 UNIT/ML-% IJ SOLN
1150.0000 [IU]/h | INTRAMUSCULAR | Status: DC
  Administered 2017-04-24: 1150 [IU]/h via INTRAVENOUS
  Filled 2017-04-24: qty 250

## 2017-04-24 MED ORDER — MIDAZOLAM HCL 2 MG/2ML IJ SOLN
INTRAMUSCULAR | Status: DC | PRN
  Administered 2017-04-24: 2 mg via INTRAVENOUS

## 2017-04-24 MED ORDER — IOPAMIDOL (ISOVUE-370) INJECTION 76%
INTRAVENOUS | Status: AC
  Filled 2017-04-24: qty 100

## 2017-04-24 MED ORDER — VERAPAMIL HCL 2.5 MG/ML IV SOLN
INTRAVENOUS | Status: AC
  Filled 2017-04-24: qty 2

## 2017-04-24 MED ORDER — METOPROLOL TARTRATE 25 MG PO TABS
25.0000 mg | ORAL_TABLET | Freq: Two times a day (BID) | ORAL | Status: DC
  Administered 2017-04-24: 25 mg via ORAL
  Filled 2017-04-24 (×2): qty 1

## 2017-04-24 MED ORDER — SODIUM CHLORIDE 0.9 % WEIGHT BASED INFUSION
1.0000 mL/kg/h | INTRAVENOUS | Status: DC
  Administered 2017-04-24: 1 mL/kg/h via INTRAVENOUS

## 2017-04-24 MED ORDER — LIDOCAINE HCL (PF) 1 % IJ SOLN
INTRAMUSCULAR | Status: DC | PRN
  Administered 2017-04-24: 2 mL

## 2017-04-24 MED ORDER — LIDOCAINE HCL (PF) 1 % IJ SOLN
INTRAMUSCULAR | Status: AC
  Filled 2017-04-24: qty 30

## 2017-04-24 MED ORDER — SODIUM CHLORIDE 0.9% FLUSH
3.0000 mL | Freq: Two times a day (BID) | INTRAVENOUS | Status: DC
  Administered 2017-04-25: 3 mL via INTRAVENOUS

## 2017-04-24 MED ORDER — MIDAZOLAM HCL 2 MG/2ML IJ SOLN
INTRAMUSCULAR | Status: AC
  Filled 2017-04-24: qty 2

## 2017-04-24 MED ORDER — FENTANYL CITRATE (PF) 100 MCG/2ML IJ SOLN
INTRAMUSCULAR | Status: AC
Start: 2017-04-24 — End: 2017-04-24
  Filled 2017-04-24: qty 2

## 2017-04-24 MED ORDER — VERAPAMIL HCL 2.5 MG/ML IV SOLN
INTRAVENOUS | Status: DC | PRN
  Administered 2017-04-24: 10 mL via INTRA_ARTERIAL

## 2017-04-24 SURGICAL SUPPLY — 10 items

## 2017-04-24 NOTE — Progress Notes (Signed)
 Progress Note  Patient Name: Frank Holloway Date of Encounter: 04/24/2017  Primary Cardiologist: Dr. Smithfield (new)  Subjective   Feeling well.  Continues to have lightheadedness that he attributes to hitting his head.   Inpatient Medications    Scheduled Meds: . aspirin EC  81 mg Oral Daily  . azaTHIOprine  50 mg Oral Daily  . multivitamin  15 mL Oral Daily  . pravastatin  10 mg Oral Daily   Continuous Infusions: . sodium chloride    . sodium chloride 1 mL/kg/hr (04/23/17 2201)  . heparin     PRN Meds: sodium chloride, acetaminophen, ALPRAZolam, nitroGLYCERIN, ondansetron (ZOFRAN) IV, sodium chloride flush, zolpidem   Vital Signs    Vitals:   04/23/17 1943 04/23/17 1944 04/23/17 2015 04/24/17 0415  BP: (!) 149/95 (!) 149/95 (!) 151/99 137/81  Pulse: 73 73 71 71  Resp:  (!) 22 18 12  Temp:   98.4 F (36.9 C) 99.1 F (37.3 C)  TempSrc:   Oral Oral  SpO2:  98% 100% 96%  Weight:   90.5 kg (199 lb 9.6 oz)   Height:   5' 11" (1.803 m)     Intake/Output Summary (Last 24 hours) at 04/24/17 0918 Last data filed at 04/24/17 0827  Gross per 24 hour  Intake                0 ml  Output             1500 ml  Net            -1500 ml   Filed Weights   04/23/17 1153 04/23/17 2015  Weight: 92.1 kg (203 lb) 90.5 kg (199 lb 9.6 oz)    Telemetry    Sinus rhythm, Mobitz II, complete heart block with 4.8s pauses - Personally Reviewed  ECG    Sinus rhythm.  Rate 71 bpm.  LBBB- Personally Reviewed  Physical Exam   GEN: No acute distress.   Neck: No JVD Cardiac: RRR, no murmurs, rubs, or gallops.  Respiratory: Clear to auscultation bilaterally. GI: Soft, nontender, non-distended  MS: No edema; No deformity. Neuro:  Nonfocal  Psych: Normal affect   Labs    Chemistry Recent Labs Lab 04/23/17 1204 04/24/17 0630  NA 139 139  K 4.2 4.1  CL 105 106  CO2 26 25  GLUCOSE 106* 98  BUN 15 11  CREATININE 0.91 0.86  CALCIUM 9.6 9.2  PROT  --  6.7  ALBUMIN  --  3.7    AST  --  55*  ALT  --  87*  ALKPHOS  --  42  BILITOT  --  1.4*  GFRNONAA >60 >60  GFRAA >60 >60  ANIONGAP 8 8     Hematology Recent Labs Lab 04/23/17 1204 04/24/17 0630  WBC 4.2 5.5  RBC 4.70 4.74  HGB 14.7 15.1  HCT 43.5 43.5  MCV 92.6 91.8  MCH 31.3 31.9  MCHC 33.8 34.7  RDW 12.7 12.5  PLT 148* 161    Cardiac Enzymes Recent Labs Lab 04/23/17 1826 04/23/17 2323 04/24/17 0630  TROPONINI 0.25* 0.19* 0.11*    Recent Labs Lab 04/23/17 1257 04/23/17 1553  TROPIPOC 0.02 0.17*     BNP Recent Labs Lab 04/23/17 1204  BNP 17.7     DDimer No results for input(s): DDIMER in the last 168 hours.   Radiology    Ct Head Wo Contrast  Result Date: 04/23/2017 CLINICAL DATA:  Unwitnessed fall.  Initial encounter. EXAM:   CT HEAD WITHOUT CONTRAST TECHNIQUE: Contiguous axial images were obtained from the base of the skull through the vertex without intravenous contrast. COMPARISON:  None. FINDINGS: Brain: No swelling or acute hemorrhage suspected. Minimal thickened appearance of the inferior septum pellucidum on 3:16 is suspected volume averaging. No discrete hemorrhage at this site on reformats. No infarct, hydrocephalus, or mass. Vascular: Negative Skull: Scalp contusion at the vertex. Negative for underlying fracture Sinuses/Orbits: Mild mucosal thickening in the paranasal sinuses. No hemosinus. IMPRESSION: Scalp contusion at the vertex without fracture. No acute intracranial finding. Electronically Signed   By: Marnee Spring M.D.   On: 04/23/2017 14:27   Ct Angio Chest Pe W And/or Wo Contrast  Result Date: 04/23/2017 CLINICAL DATA:  Syncopal episode today at work. EXAM: CT ANGIOGRAPHY CHEST WITH CONTRAST TECHNIQUE: Multidetector CT imaging of the chest was performed using the standard protocol during bolus administration of intravenous contrast. Multiplanar CT image reconstructions and MIPs were obtained to evaluate the vascular anatomy. CONTRAST:  100 cc of Isovue 370  COMPARISON:  Chest radiograph 02/07/2011. FINDINGS: Cardiovascular: The quality of this exam for evaluation of pulmonary embolism is good. No evidence of pulmonary embolism. Aortic atherosclerosis. Mild cardiomegaly. Multivessel coronary artery atherosclerosis. Mediastinum/Nodes: No mediastinal or hilar adenopathy. Lungs/Pleura: No pleural fluid.  Mild lingular volume loss. Upper Abdomen: Gallstones of up to 13 mm, without surrounding inflammation. Normal imaged portions of the liver, spleen, stomach, pancreas, adrenal glands, kidneys. Musculoskeletal: Hypoattenuating upper thoracic vertebral body lesions are likely hemangiomas and are tiny. Largest is in the T6 vertebral body, 8 mm. Review of the MIP images confirms the above findings. IMPRESSION: 1.  No evidence of pulmonary embolism. 2.  Coronary artery atherosclerosis. Aortic atherosclerosis. 3. Cholelithiasis. Electronically Signed   By: Jeronimo Greaves M.D.   On: 04/23/2017 17:03    Cardiac Studies   Echo, LHC pending.   Patient Profile     66 y.o. male Frank Holloway is a 44M with no significant PMH here with syncope, found to have newly diagnosed LBBB and NSTEMI.  Assessment & Plan    # Syncope: # LBBB: # NSTEMI: Currently asymptomatic and awaiting cath.  Will order echo.  Continue aspirin, heparin, and pravastatin.   # CHB: Frank Holloway had episodes of CHB and Mobitz II overnight after taking carvedilol 3.125mg .  Unfortunately he received another dose this am.  Will discontinue carvedilol and avoid nodal agents.  Monitor closely on telemetry.  # Hypertension: BP controlled on carvedilol, but will hold as above.  Given his episodes of CHB, we will not start another agent until after cath.   # Hyperlipidemia: Lipid panel pending.  Continue pravastatin for now.   Signed, Chilton Si, MD  04/24/2017, 9:18 AM

## 2017-04-24 NOTE — Progress Notes (Signed)
ANTICOAGULATION CONSULT NOTE - Follow Up Consult  Pharmacy Consult for Heparin Indication: chest pain/ACS  Allergies  Allergen Reactions  . Lipitor [Atorvastatin]     Elevated liver function    Patient Measurements: Height:  (180.3 cm) Weight: 199 lb 9.6 oz (90.5 kg) IBW/kg (Calculated) : 75.3  Vital Signs: Temp: 99.1 F (37.3 C) (05/02 0415) Temp Source: Oral (05/02 0415) BP: 137/81 (05/02 0415) Pulse Rate: 71 (05/02 0415)  Labs:  Recent Labs  04/23/17 1204 04/23/17 1826 04/23/17 2323 04/24/17 0630  HGB 14.7  --   --  15.1  HCT 43.5  --   --  43.5  PLT 148*  --   --  161  LABPROT  --   --  14.5  --   INR  --   --  1.12  --   HEPARINUNFRC  --   --  0.40 0.29*  CREATININE 0.91  --   --  0.86  TROPONINI  --  0.25* 0.19* 0.11*    Estimated Creatinine Clearance: 97.3 mL/min (by C-G formula based on SCr of 0.86 mg/dL).   Medications:  Heparin @ 1100 units/hr  Assessment: 66yom continues on heparin for chest pain/elevated troponins with plan for cath today. Heparin level is just slightly below goal at 0.29. CBC stable. No bleeding.  Goal of Therapy:  Heparin level 0.3-0.7 units/ml Monitor platelets by anticoagulation protocol: Yes   Plan:  1) Increase heparin to 1150 units/hr 2) Follow up after cath  Frank Holloway 04/24/2017,8:48 AM

## 2017-04-24 NOTE — H&P (View-Only) (Signed)
Progress Note  Patient Name: Frank Holloway Date of Encounter: 04/24/2017  Primary Cardiologist: Dr. Duke Salvia (new)  Subjective   Feeling well.  Continues to have lightheadedness that he attributes to hitting his head.   Inpatient Medications    Scheduled Meds: . aspirin EC  81 mg Oral Daily  . azaTHIOprine  50 mg Oral Daily  . multivitamin  15 mL Oral Daily  . pravastatin  10 mg Oral Daily   Continuous Infusions: . sodium chloride    . sodium chloride 1 mL/kg/hr (04/23/17 2201)  . heparin     PRN Meds: sodium chloride, acetaminophen, ALPRAZolam, nitroGLYCERIN, ondansetron (ZOFRAN) IV, sodium chloride flush, zolpidem   Vital Signs    Vitals:   04/23/17 1943 04/23/17 1944 04/23/17 2015 04/24/17 0415  BP: (!) 149/95 (!) 149/95 (!) 151/99 137/81  Pulse: 73 73 71 71  Resp:  (!) Temp:   98.4 F (36.9 C) 99.1 F (37.3 C)  TempSrc:   Oral Oral  SpO2:  98% 100% 96%  Weight:   90.5 kg (199 lb 9.6 oz)   Height:    (1.803 m)     Intake/Output Summary (Last 24 hours) at 04/24/17 0918 Last data filed at 04/24/17 0827  Gross per 24 hour  Intake                0 ml  Output             1500 ml  Net            -1500 ml   Filed Weights   04/23/17 1153 04/23/17 2015  Weight: 92.1 kg (203 lb) 90.5 kg (199 lb 9.6 oz)    Telemetry    Sinus rhythm, Mobitz II, complete heart block with 4.8s pauses - Personally Reviewed  ECG    Sinus rhythm.  Rate 71 bpm.  LBBB- Personally Reviewed  Physical Exam   GEN: No acute distress.   Neck: No JVD Cardiac: RRR, no murmurs, rubs, or gallops.  Respiratory: Clear to auscultation bilaterally. GI: Soft, nontender, non-distended  MS: No edema; No deformity. Neuro:  Nonfocal  Psych: Normal affect   Labs    Chemistry Recent Labs Lab 04/23/17 1204 04/24/17 0630  NA 139 139  K 4.2 4.1  CL 105 106  CO2 26 25  GLUCOSE 106* 98  BUN 15 11  CREATININE 0.91 0.86  CALCIUM 9.6 9.2  PROT  --  6.7  ALBUMIN  --  3.7    AST  --  55*  ALT  --  87*  ALKPHOS  --  42  BILITOT  --  1.4*  GFRNONAA >60 >60  GFRAA >60 >60  ANIONGAP 8 8     Hematology Recent Labs Lab 04/23/17 1204 04/24/17 0630  WBC 4.2 5.5  RBC 4.70 4.74  HGB 14.7 15.1  HCT 43.5 43.5  MCV 92.6 91.8  MCH 31.3 31.9  MCHC 33.8 34.7  RDW 12.7 12.5  PLT 148* 161    Cardiac Enzymes Recent Labs Lab 04/23/17 1826 04/23/17 2323 04/24/17 0630  TROPONINI 0.25* 0.19* 0.11*    Recent Labs Lab 04/23/17 1257 04/23/17 1553  TROPIPOC 0.02 0.17*     BNP Recent Labs Lab 04/23/17 1204  BNP 17.7     DDimer No results for input(s): DDIMER in the last 168 hours.   Radiology    Ct Head Wo Contrast  Result Date: 04/23/2017 CLINICAL DATA:  Unwitnessed fall.  Initial encounter. EXAM:  CT HEAD WITHOUT CONTRAST TECHNIQUE: Contiguous axial images were obtained from the base of the skull through the vertex without intravenous contrast. COMPARISON:  None. FINDINGS: Brain: No swelling or acute hemorrhage suspected. Minimal thickened appearance of the inferior septum pellucidum on 3:16 is suspected volume averaging. No discrete hemorrhage at this site on reformats. No infarct, hydrocephalus, or mass. Vascular: Negative Skull: Scalp contusion at the vertex. Negative for underlying fracture Sinuses/Orbits: Mild mucosal thickening in the paranasal sinuses. No hemosinus. IMPRESSION: Scalp contusion at the vertex without fracture. No acute intracranial finding. Electronically Signed   By: Marnee Spring M.D.   On: 04/23/2017 14:27   Ct Angio Chest Pe W And/or Wo Contrast  Result Date: 04/23/2017 CLINICAL DATA:  Syncopal episode today at work. EXAM: CT ANGIOGRAPHY CHEST WITH CONTRAST TECHNIQUE: Multidetector CT imaging of the chest was performed using the standard protocol during bolus administration of intravenous contrast. Multiplanar CT image reconstructions and MIPs were obtained to evaluate the vascular anatomy. CONTRAST:  100 cc of Isovue 370  COMPARISON:  Chest radiograph 02/07/2011. FINDINGS: Cardiovascular: The quality of this exam for evaluation of pulmonary embolism is good. No evidence of pulmonary embolism. Aortic atherosclerosis. Mild cardiomegaly. Multivessel coronary artery atherosclerosis. Mediastinum/Nodes: No mediastinal or hilar adenopathy. Lungs/Pleura: No pleural fluid.  Mild lingular volume loss. Upper Abdomen: Gallstones of up to 13 mm, without surrounding inflammation. Normal imaged portions of the liver, spleen, stomach, pancreas, adrenal glands, kidneys. Musculoskeletal: Hypoattenuating upper thoracic vertebral body lesions are likely hemangiomas and are tiny. Largest is in the T6 vertebral body, 8 mm. Review of the MIP images confirms the above findings. IMPRESSION: 1.  No evidence of pulmonary embolism. 2.  Coronary artery atherosclerosis. Aortic atherosclerosis. 3. Cholelithiasis. Electronically Signed   By: Jeronimo Greaves M.D.   On: 04/23/2017 17:03    Cardiac Studies   Echo, LHC pending.   Patient Profile     66 y.o. male Frank Holloway is a 44M with no significant PMH here with syncope, found to have newly diagnosed LBBB and NSTEMI.  Assessment & Plan    # Syncope: # LBBB: # NSTEMI: Currently asymptomatic and awaiting cath.  Will order echo.  Continue aspirin, heparin, and pravastatin.   # CHB: Frank Holloway had episodes of CHB and Mobitz II overnight after taking carvedilol 3.125mg .  Unfortunately he received another dose this am.  Will discontinue carvedilol and avoid nodal agents.  Monitor closely on telemetry.  # Hypertension: BP controlled on carvedilol, but will hold as above.  Given his episodes of CHB, we will not start another agent until after cath.   # Hyperlipidemia: Lipid panel pending.  Continue pravastatin for now.   Signed, Chilton Si, MD  04/24/2017, 9:18 AM

## 2017-04-24 NOTE — Progress Notes (Signed)
ANTICOAGULATION CONSULT NOTE Pharmacy Consult for heparin Indication: chest pain/ACS  Allergies  Allergen Reactions  . Lipitor [Atorvastatin]     Elevated liver function    Patient Measurements: Height:  (180.3 cm) Weight: 199 lb 9.6 oz (90.5 kg) IBW/kg (Calculated) : 75.3 Heparin Dosing Weight: 92.1kg  Vital Signs: Temp: 98.4 F (36.9 C) (05/01 2015) Temp Source: Oral (05/01 2015) BP: 151/99 (05/01 2015) Pulse Rate: 71 (05/01 2015)  Labs:  Recent Labs  04/23/17 1204 04/23/17 1826 04/23/17 2323  HGB 14.7  --   --   HCT 43.5  --   --   PLT 148*  --   --   LABPROT  --   --  14.5  INR  --   --  1.12  HEPARINUNFRC  --   --  0.40  CREATININE 0.91  --   --   TROPONINI  --  0.25* 0.19*    Estimated Creatinine Clearance: 91.9 mL/min (by C-G formula based on SCr of 0.91 mg/dL).  Assessment: 66 yo Male with syncope/EKG changes, possible ACS for heparin.  Chest CT negative for PE  Goal of Therapy:  Heparin level 0.3-0.7 units/ml Monitor platelets by anticoagulation protocol: Yes   Plan:  Continue Heparin at current rate  Follow-up am labs.   Geannie Risen, PharmD, BCPS

## 2017-04-24 NOTE — Interval H&P Note (Signed)
Cath Lab Visit (complete for each Cath Lab visit)  Clinical Evaluation Leading to the Procedure:   ACS: No.  Non-ACS:    Anginal Classification: CCS III  Anti-ischemic medical therapy: No Therapy  Non-Invasive Test Results: No non-invasive testing performed  Prior CABG: No previous CABG      History and Physical Interval Note:  04/24/2017 4:08 PM  Frank Holloway  has presented today for surgery, with the diagnosis of cp  The various methods of treatment have been discussed with the patient and family. After consideration of risks, benefits and other options for treatment, the patient has consented to  Procedure(s): Left Heart Cath and Coronary Angiography (N/A) as a surgical intervention .  The patient's history has been reviewed, patient examined, no change in status, stable for surgery.  I have reviewed the patient's chart and labs.  Questions were answered to the patient's satisfaction.     Nicki Guadalajara

## 2017-04-25 ENCOUNTER — Encounter (HOSPITAL_COMMUNITY): Payer: Self-pay | Admitting: Cardiovascular Disease

## 2017-04-25 ENCOUNTER — Inpatient Hospital Stay (HOSPITAL_COMMUNITY): Payer: BLUE CROSS/BLUE SHIELD

## 2017-04-25 ENCOUNTER — Encounter (HOSPITAL_COMMUNITY)
Admission: EM | Disposition: A | Discharge status: Home / Self Care | Payer: Self-pay | Source: Home / Self Care | Attending: Cardiovascular Disease

## 2017-04-25 DIAGNOSIS — I441 Atrioventricular block, second degree: Secondary | ICD-10-CM

## 2017-04-25 DIAGNOSIS — R55 Syncope and collapse: Secondary | ICD-10-CM

## 2017-04-25 HISTORY — PX: PACEMAKER IMPLANT: EP1218

## 2017-04-25 LAB — BASIC METABOLIC PANEL
ANION GAP: 6 (ref 5–15)
BUN: 12 mg/dL (ref 6–20)
CALCIUM: 8.8 mg/dL — AB (ref 8.9–10.3)
CO2: 28 mmol/L (ref 22–32)
Chloride: 105 mmol/L (ref 101–111)
Creatinine, Ser: 0.86 mg/dL (ref 0.61–1.24)
GFR calc non Af Amer: >60 mL/min (ref >60)
GLUCOSE: 93 mg/dL (ref 65–99)
POTASSIUM: 4 mmol/L (ref 3.5–5.1)
Sodium: 139 mmol/L (ref 135–145)

## 2017-04-25 LAB — CBC
HCT: 40.8 % (ref 39.0–52.0)
HEMOGLOBIN: 14 g/dL (ref 13.0–17.0)
MCH: 31.7 pg (ref 26.0–34.0)
MCHC: 34.3 g/dL (ref 30.0–36.0)
MCV: 92.5 fL (ref 78.0–100.0)
PLATELETS: 155 10*3/uL (ref 150–400)
RBC: 4.41 MIL/uL (ref 4.22–5.81)
RDW: 12.4 % (ref 11.5–15.5)
WBC: 5.1 10*3/uL (ref 4.0–10.5)

## 2017-04-25 LAB — ECHOCARDIOGRAM COMPLETE
HEIGHTINCHES: 71 in
WEIGHTICAEL: 3171.1 [oz_av]

## 2017-04-25 SURGERY — PACEMAKER IMPLANT

## 2017-04-25 MED ORDER — CHLORHEXIDINE GLUCONATE 4 % EX LIQD: 60.0000 mL | Freq: Once | CUTANEOUS | Status: DC

## 2017-04-25 MED ORDER — MIDAZOLAM HCL 5 MG/5ML IJ SOLN
INTRAMUSCULAR | Status: DC | PRN
  Administered 2017-04-25 (×2): 1 mg via INTRAVENOUS

## 2017-04-25 MED ORDER — HEPARIN (PORCINE) IN NACL 2-0.9 UNIT/ML-% IJ SOLN
INTRAMUSCULAR | Status: DC | PRN
  Administered 2017-04-25: 1000 mL

## 2017-04-25 MED ORDER — ONDANSETRON HCL 4 MG/2ML IJ SOLN: 4.0000 mg | Freq: Four times a day (QID) | INTRAMUSCULAR | Status: DC | PRN

## 2017-04-25 MED ORDER — LISINOPRIL 10 MG PO TABS
10.0000 mg | ORAL_TABLET | Freq: Every day | ORAL | Status: DC
  Administered 2017-04-25 – 2017-04-26 (×2): 10 mg via ORAL
  Filled 2017-04-25 (×2): qty 1

## 2017-04-25 MED ORDER — IOPAMIDOL (ISOVUE-370) INJECTION 76%
INTRAVENOUS | Status: AC
  Filled 2017-04-25: qty 50

## 2017-04-25 MED ORDER — MIDAZOLAM HCL 5 MG/5ML IJ SOLN
INTRAMUSCULAR | Status: AC
  Filled 2017-04-25: qty 5

## 2017-04-25 MED ORDER — SODIUM CHLORIDE 0.9 % IV SOLN: 250.0000 mL | INTRAVENOUS | Status: DC | PRN

## 2017-04-25 MED ORDER — HYDROCODONE-ACETAMINOPHEN 5-325 MG PO TABS
1.0000 | ORAL_TABLET | ORAL | Status: DC | PRN
  Filled 2017-04-25: qty 2

## 2017-04-25 MED ORDER — SODIUM CHLORIDE 0.9 % IR SOLN
80.0000 mg | Status: AC
  Administered 2017-04-25: 80 mg
  Filled 2017-04-25: qty 2

## 2017-04-25 MED ORDER — HEPARIN (PORCINE) IN NACL 2-0.9 UNIT/ML-% IJ SOLN
INTRAMUSCULAR | Status: AC
  Filled 2017-04-25: qty 1000

## 2017-04-25 MED ORDER — SODIUM CHLORIDE 0.9 % IR SOLN
Status: AC
  Filled 2017-04-25: qty 2

## 2017-04-25 MED ORDER — CEFAZOLIN SODIUM-DEXTROSE 2-4 GM/100ML-% IV SOLN
2.0000 g | INTRAVENOUS | Status: AC
  Administered 2017-04-25: 2 g via INTRAVENOUS
  Filled 2017-04-25: qty 100

## 2017-04-25 MED ORDER — FENTANYL CITRATE (PF) 100 MCG/2ML IJ SOLN
INTRAMUSCULAR | Status: DC | PRN
  Administered 2017-04-25 (×2): 12.5 ug via INTRAVENOUS

## 2017-04-25 MED ORDER — FENTANYL CITRATE (PF) 100 MCG/2ML IJ SOLN
INTRAMUSCULAR | Status: AC
  Filled 2017-04-25: qty 2

## 2017-04-25 MED ORDER — SODIUM CHLORIDE 0.9 % IV SOLN
INTRAVENOUS | Status: DC | PRN
  Administered 2017-04-25: 50 mL/h via INTRAVENOUS

## 2017-04-25 MED ORDER — ADULT MULTIVITAMIN W/MINERALS CH
1.0000 | ORAL_TABLET | Freq: Every day | ORAL | Status: DC
  Administered 2017-04-26: 1 via ORAL
  Filled 2017-04-25: qty 1

## 2017-04-25 MED ORDER — LIDOCAINE HCL (PF) 1 % IJ SOLN
INTRAMUSCULAR | Status: AC
  Filled 2017-04-25: qty 60

## 2017-04-25 MED ORDER — SODIUM CHLORIDE 0.9% FLUSH: 3.0000 mL | INTRAVENOUS | Status: DC | PRN

## 2017-04-25 MED ORDER — SODIUM CHLORIDE 0.45 % IV SOLN: INTRAVENOUS | Status: DC

## 2017-04-25 MED ORDER — CEFAZOLIN SODIUM-DEXTROSE 2-4 GM/100ML-% IV SOLN
INTRAVENOUS | Status: AC
  Filled 2017-04-25: qty 100

## 2017-04-25 MED ORDER — IOPAMIDOL (ISOVUE-370) INJECTION 76%
INTRAVENOUS | Status: DC | PRN
  Administered 2017-04-25: 15 mL via INTRAVENOUS

## 2017-04-25 MED ORDER — SODIUM CHLORIDE 0.9% FLUSH
3.0000 mL | Freq: Two times a day (BID) | INTRAVENOUS | Status: DC
  Administered 2017-04-25: 3 mL via INTRAVENOUS

## 2017-04-25 MED ORDER — CEFAZOLIN SODIUM-DEXTROSE 1-4 GM/50ML-% IV SOLN
1.0000 g | Freq: Four times a day (QID) | INTRAVENOUS | Status: AC
  Administered 2017-04-25 – 2017-04-26 (×3): 1 g via INTRAVENOUS
  Filled 2017-04-25 (×3): qty 50

## 2017-04-25 MED ORDER — EZETIMIBE 10 MG PO TABS
10.0000 mg | ORAL_TABLET | Freq: Every day | ORAL | Status: DC
  Administered 2017-04-25 – 2017-04-26 (×2): 10 mg via ORAL
  Filled 2017-04-25 (×2): qty 1

## 2017-04-25 MED ORDER — LIDOCAINE HCL (PF) 1 % IJ SOLN
INTRAMUSCULAR | Status: DC | PRN
  Administered 2017-04-25: 40 mL via SUBCUTANEOUS

## 2017-04-25 MED ORDER — SODIUM CHLORIDE 0.9 % IV SOLN: INTRAVENOUS | Status: DC

## 2017-04-25 MED ORDER — ACETAMINOPHEN 325 MG PO TABS
325.0000 mg | ORAL_TABLET | ORAL | Status: DC | PRN
  Administered 2017-04-25: 650 mg via ORAL
  Filled 2017-04-25: qty 2

## 2017-04-25 SURGICAL SUPPLY — 7 items
CABLE SURGICAL S-101-97-12 (CABLE) ×3 IMPLANT
LEAD TENDRIL MRI 52CM LPA1200M (Lead) ×3 IMPLANT
LEAD TENDRIL MRI 58CM LPA1200M (Lead) ×3 IMPLANT
PACEMAKER ASSURITY DR-RF (Pacemaker) ×3 IMPLANT
PAD DEFIB LIFELINK (PAD) ×3 IMPLANT
SHEATH CLASSIC 8F (SHEATH) ×6 IMPLANT
TRAY PACEMAKER INSERTION (PACKS) ×3 IMPLANT

## 2017-04-25 NOTE — Progress Notes (Signed)
  Echocardiogram 2D Echocardiogram has been performed.  Pieter PartridgeBrooke S Derran Sear 04/25/2017, 10:55 AM

## 2017-04-25 NOTE — Consult Note (Signed)
ELECTROPHYSIOLOGY CONSULT NOTE    Patient ID: Frank Holloway MRN: [id number], DOB/AGE: [birth year] 66 y.o.  Admit date: 04/23/2017 Date of Consult: 04/25/2017  Primary Physician: Mickie Hillier, MD Primary Cardiologist: none  Reason for Consultation: syncope, CHB  HPI: Frank Holloway is a 66 y.o. male who is being seen today for the evaluation of syncope, CHB at the request of Dr. Duke Salvia.  PMHx include autoimmune hepatitis, hyperlipidemia, very recently his PMD noted elevated BP, but not started on medicines yet.  He had the a flu-like illness a couple weeks ago but resolved after only a couple days, and has been feeling very well.  He denies any CP, palpitations or SOB,no exertional intolerances.  No hx of syncope, rarely upon standing would get fleetingly lightheaded.  He was at work, had been at his desk a minute or 2 talking with coworkers, stood up, walked a few steps to punch in, was turning to walk away when he fainted.  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  He did not have any nausea, diaphoresis, no CP.  He woke feeling fine, but not sure what had happened.  Here he had not had any symptoms until this morning when he was ambulating to the BR felt briefly lightheaded and but not near syncopal, and has felt some light fluttering a couple times since then.  He is on no rate limiting drugs at home, here he got coreg 3.125 2 doses, last 04/23/17 @ 0800, and a dose of metoprolol tartrate yesterday @ 2144  LABS K+ 4.0 BUN/Creat 12.0.86 WBC 5.1 H/H 14/40 plts 155 AST 55 ALT 87 Coag wnl TSH 1/037    Past Medical History:  Diagnosis Date  . BPH (benign prostatic hyperplasia)   . 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 Gallstones   . GERD (gastroesophageal reflux disease)   . Hepatitis    probable  autoimmune  . Hyperlipidemia   . Kidney stones      Surgical History:  Past Surgical History:  Procedure Laterality Date  . COLONOSCOPY  2001, 2003  . LEFT HEART CATH AND CORONARY ANGIOGRAPHY N/A 04/24/2017   Procedure: Left Heart Cath and Coronary Angiography;  Surgeon: Lennette Bihari, MD;  Location: [facility] INVASIVE CV LAB;  Service: Cardiovascular;  Laterality: N/A;  . LIVER BIOPSY    . WISDOM TOOTH EXTRACTION       Prescriptions Prior to Admission  Medication Sig Dispense Refill Last Dose  . azaTHIOprine (IMURAN) 50 MG tablet Take 1 tablet by mouth daily.   04/23/2017 at Unknown time  . Calcium-Magnesium-Vitamin D (CALCIUM MAGNESIUM PO) Take 1 tablet by mouth daily.   04/23/2017 at Unknown time  . milk thistle 175 MG tablet Take 175 mg by mouth daily.   04/22/2017 at Unknown time  . Multiple Vitamins-Minerals (MULTIVITAMIN PO) Take 1 tablet by mouth daily.   04/23/2017 at Unknown time  . pravastatin (PRAVACHOL) 10 MG tablet Take 1 tablet by mouth daily.   04/22/2017 at Unknown time    Inpatient Medications:  . aspirin  81 mg Oral Daily  . azaTHIOprine  50 mg Oral Daily  . ezetimibe  10 mg Oral Daily  . lisinopril  10 mg Oral Daily  . multivitamin with minerals  1 tablet Oral Daily  . pravastatin  10 mg Oral Daily  . sodium chloride flush  3 mL Intravenous Q12H    Allergies:  Allergies  Allergen Reactions  . Lipitor [Atorvastatin]     Elevated liver function    Social History   Social History  . Marital status: Single    Spouse name: N/A  . Number of children: N/A  . Years of education: N/A   Occupational History  . 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 Smoking status: Never Smoker  . Smokeless tobacco: Never Used  . Alcohol use Yes     Comment: beer: 4 servings per week  . Drug use: No  . Sexual activity: Not on file   Other Topics Concern  . Not on file   Social History Narrative   Caffeine Use:  daily     Family History  Problem Relation Age of Onset  . Cirrhosis Father   . Breast cancer Mother   . Sarcoidosis Sister   . Leukemia    . Heart attack    . Sarcoidosis    . Asthma Maternal Grandmother      Review of Systems: All other systems reviewed and are otherwise negative except as noted above.  Physical Exam: Vitals:   04/24/17 1930 04/24/17 2000 04/24/17 2030 04/25/17 0610  BP: (!) 142/86 139/72 137/78 (!) 153/92  Pulse:  67 61 60  Resp:   16 16  Temp:   98.4 F (36.9 C) 98.5 F (36.9 C)  TempSrc:   Oral Oral  SpO2:  95% 98% 98%  Weight:    198 lb 3.1 oz (89.9 kg)  Height:        GEN- The patient is well appearing, alert and oriented x 3 today.   HEENT: normocephalic, atraumatic; sclera clear, conjunctiva pink; hearing intact; oropharynx clear; neck supple, no JVP Lymph- no cervical lymphadenopathy Lungs- CTA b/l, normal work of breathing.  No wheezes, rales, rhonchi Heart- RRR, no murmurs, rubs or gallops, PMI not laterally displaced GI- soft, non-tender, non-distended, bowel sounds present Extremities- no clubbing, cyanosis, or edema MS- no significant deformity or atrophy Skin- warm and dry, no rash or lesion Psych- euthymic mood, full affect Neuro- no gross deficits observed  Labs:   Lab Results  Component Value Date   WBC 5.1 04/25/2017   HGB 14.0 04/25/2017   HCT 40.8 04/25/2017   MCV 92.5 04/25/2017   PLT 155 04/25/2017    Recent Labs Lab 04/24/17 0630 04/25/17 0205  NA 139 139  K 4.1 4.0  CL 106 105  CO2 25 28  BUN 11 12  CREATININE 0.86 0.86  CALCIUM 9.2 8.8*  PROT 6.7  --   BILITOT 1.4*  --   ALKPHOS 42  --   ALT 87*  --   AST 55*  --   GLUCOSE 98 93      Radiology/Studies:  Ct Head Wo Contrast Result Date: 04/23/2017 CLINICAL DATA:  Unwitnessed fall.  Initial encounter. EXAM: CT HEAD WITHOUT CONTRAST TECHNIQUE: Contiguous axial images were obtained from the base of the skull through the vertex without intravenous  contrast. COMPARISON:  None. FINDINGS: Brain: No swelling or acute hemorrhage suspected. Minimal thickened appearance of the inferior septum pellucidum on 3:16 is suspected volume averaging. No discrete hemorrhage at this site on reformats. No infarct, hydrocephalus, or mass. Vascular: Negative Skull: Scalp contusion at the vertex. Negative for underlying fracture Sinuses/Orbits: Mild mucosal thickening in the paranasal  sinuses. No hemosinus. IMPRESSION: Scalp contusion at the vertex without fracture. No acute intracranial finding. Electronically Signed   By: Marnee Spring M.D.   On: 04/23/2017 14:27   Ct Angio Chest Pe W And/or Wo Contrast Result Date: 04/23/2017 CLINICAL DATA:  Syncopal episode today at work. EXAM: CT ANGIOGRAPHY CHEST WITH CONTRAST TECHNIQUE: Multidetector CT imaging of the chest was performed using the standard protocol during bolus administration of intravenous contrast. Multiplanar CT image reconstructions and MIPs were obtained to evaluate the vascular anatomy. CONTRAST:  100 cc of Isovue 370 COMPARISON:  Chest radiograph 02/07/2011. FINDINGS: Cardiovascular: The quality of this exam for evaluation of pulmonary embolism is good. No evidence of pulmonary embolism. Aortic atherosclerosis. Mild cardiomegaly. Multivessel coronary artery atherosclerosis. Mediastinum/Nodes: No mediastinal or hilar adenopathy. Lungs/Pleura: No pleural fluid.  Mild lingular volume loss. Upper Abdomen: Gallstones of up to 13 mm, without surrounding inflammation. Normal imaged portions of the liver, spleen, stomach, pancreas, adrenal glands, kidneys. Musculoskeletal: Hypoattenuating upper thoracic vertebral body lesions are likely hemangiomas and are tiny. Largest is in the T6 vertebral body, 8 mm. Review of the MIP images confirms the above findings. IMPRESSION: 1.  No evidence of pulmonary embolism. 2.  Coronary artery atherosclerosis. Aortic atherosclerosis. 3. Cholelithiasis. Electronically Signed   By: Jeronimo Greaves M.D.   On: 04/23/2017 17:03    EKG: SR, 68bpm, LBBB, borderline 1st degree AVblock TELEMETRY: SR, occ PVC's, 3-4 second CHB episode yesterday, this morning 11 second pause w/CHB >> AFlutter 70's>> SR  04/24/17: LHC Conclusion   Prox RCA lesion, 30 %stenosed.  Mid RCA lesion, 20 %stenosed.  Prox LAD lesion, 40 %stenosed.  Ost 1st Diag to 1st Diag lesion, 40 %stenosed.   Mild two vessel CAD with evidence for mild coronary calcification of the LAD with 40% proximal diagonal stenosis and 40% LAD stenosis after the diagonal vessel; normal left circumflex coronary artery; and 30 in 20% proximal and mid RCA stenoses.  Normal LV function without focal segmental wall motion abnormalities.  Ejection fraction 55%.  RECOMMENDATION; The patient has recently noticed exertional angina.  He will be started on low-dose beta blocker therapy and aspirin.  He has a history of LFT elevation which may preclude more aggressive statin therapy above his current low dose pravastatin 10 mg.  Consider adding Zetia 10 mg for additional LDL lowering to allow for improved plaque stability and hopefully regression.     Assessment and Plan:   1. Syncope     Abrupt without warning     LBBB on his presenting EKG and abnormal Trop prompted cath with no obstructive disease and normal LVEF     He was initially rx ASA, BB and heparin with concerns of CAD w/coronary Ca++ on CT     Post cath coreg changed to lopressor     Overnight  observed to have 2 CHB episodes with up to 5 seconds and BB stopped     This morning 11 second pause associated with CHB, >> AFlutter 70's and subsequently back in SR 70's  He did get some BB here, thogh he presented with syncope that very much sounds of heartblock not on any rate limiting/nodal blocking drugs.  He has also had brief AFlutter.  Would be unable to use rate controlling meds if needed.  I think he will need a pacemaker, discussed this with the patient, risks/benefits,  and he is agreeable to proceed.  Dr. Johney Frame to see.   2. New PAFib     CHA2DS2Vasc is  2 with new HTN     Was of very brief suration, +/- an hour     For now, no a/c      3. HTN     On ACE here   Signed, Francis Dowse, PA-C 04/25/2017 11:35 AM  I have seen, examined the patient, and reviewed the above assessment and plan.  On exam, RRR.  Changes to above are made where necessary.  Pt with  Mobitz II second degree AV block and transient CHB with syncope.  His conduction system disease precedes his admission and was associated with syncope.  There are not reversible causes.  I would therefore recommend pacemaker implantation at this time.  Risks, benefits, alternatives to pacemaker implantation were discussed in detail with the patient today. The patient understands that the risks include but are not limited to bleeding, infection, pneumothorax, perforation, tamponade, vascular damage, renal failure, MI, stroke, death,  and lead dislodgement and wishes to proceed. We will therefore schedule the procedure at the next available time today.   Co Sign: Hillis Range, MD 04/25/2017 1:25 PM

## 2017-04-25 NOTE — Interval H&P Note (Signed)
History and Physical Interval Note:  04/25/2017 1:27 PM  Frank Holloway  has presented today for surgery, with the diagnosis of syncope/flutter  The various methods of treatment have been discussed with the patient and family. After consideration of risks, benefits and other options for treatment, the patient has consented to  Procedure(s): Pacemaker Implant (N/A) as a surgical intervention .  The patient's history has been reviewed, patient examined, no change in status, stable for surgery.  I have reviewed the patient's chart and labs.  Questions were answered to the patient's satisfaction.     Hillis RangeJames Samaiyah Howes

## 2017-04-25 NOTE — Progress Notes (Signed)
Progress Note  Patient Name: Frank Holloway Date of Encounter: 04/25/2017  Primary Cardiologist: New to Dr. Duke Salvia  Subjective   Top of head is sore. Otherwise he feels totally asymptomatic. No CP or SOB.  Inpatient Medications    Scheduled Meds: . aspirin  81 mg Oral Daily  . azaTHIOprine  50 mg Oral Daily  . metoprolol tartrate  25 mg Oral BID  . multivitamin  15 mL Oral Daily  . pravastatin  10 mg Oral Daily  . sodium chloride flush  3 mL Intravenous Q12H   Continuous Infusions: . sodium chloride    . sodium chloride     PRN Meds: sodium chloride, sodium chloride, acetaminophen, ALPRAZolam, diazepam, nitroGLYCERIN, ondansetron (ZOFRAN) IV, sodium chloride flush, sodium chloride flush, zolpidem   Vital Signs    Vitals:   04/24/17 1930 04/24/17 2000 04/24/17 2030 04/25/17 0610  BP: (!) 142/86 139/72 137/78 (!) 153/92  Pulse:  67 61 60  Resp:   16 16  Temp:   98.4 F (36.9 C) 98.5 F (36.9 C)  TempSrc:   Oral Oral  SpO2:  95% 98% 98%  Weight:    198 lb 3.1 oz (89.9 kg)  Height:        Intake/Output Summary (Last 24 hours) at 04/25/17 0847 Last data filed at 04/25/17 0500  Gross per 24 hour  Intake          1977.65 ml  Output             1400 ml  Net           577.65 ml   Filed Weights   04/23/17 1153 04/23/17 2015 04/25/17 0610  Weight: 203 lb (92.1 kg) 199 lb 9.6 oz (90.5 kg) 198 lb 3.1 oz (89.9 kg)    Telemetry    NSR overnight, two brief 1.7 sec pauses. One appears possibly representative of Mobitz 2 versus blocked PAC but difficult to ascertain given artifact. Other event appears possibly an ectopic atrial beat without QRS. No further prolonged pauses. Personally Reviewed   Physical Exam   GEN: No acute distress.  HEENT: Normocephalic, atraumatic, sclera non-icteric. Neck: No JVD or bruits. Cardiac: RRR no murmurs, rubs, or gallops.  Radials/DP/PT 1+ and equal bilaterally.  Respiratory: Clear to auscultation bilaterally. Breathing is  unlabored. GI: Soft, nontender, non-distended, BS +x 4. MS: no deformity. Extremities: No clubbing or cyanosis. No edema. Distal pedal pulses are 2+ and equal bilaterally. Right radial cath site without hematoma or ecchymosis; good pulse. Neuro:  AAOx3. Follows commands. Psych:  Responds to questions appropriately with a normal affect.  Labs    Chemistry Recent Labs Lab 04/23/17 1204 04/24/17 0630 04/25/17 0205  NA 139 139 139  K 4.2 4.1 4.0  CL 105 106 105  CO2 26 25 28   GLUCOSE 106* 98 93  BUN 15 11 12   CREATININE 0.91 0.86 0.86  CALCIUM 9.6 9.2 8.8*  PROT  --  6.7  --   ALBUMIN  --  3.7  --   AST  --  55*  --   ALT  --  87*  --   ALKPHOS  --  42  --   BILITOT  --  1.4*  --   GFRNONAA >60 >60 >60  GFRAA >60 >60 >60  ANIONGAP 8 8 6      Hematology Recent Labs Lab 04/23/17 1204 04/24/17 0630 04/25/17 0205  WBC 4.2 5.5 5.1  RBC 4.70 4.74 4.41  HGB 14.7 15.1 14.0  HCT 43.5 43.5 40.8  MCV 92.6 91.8 92.5  MCH 31.3 31.9 31.7  MCHC 33.8 34.7 34.3  RDW 12.7 12.5 12.4  PLT 148* 161 155    Cardiac Enzymes Recent Labs Lab 04/23/17 1826 04/23/17 2323 04/24/17 0630  TROPONINI 0.25* 0.19* 0.11*    Recent Labs Lab 04/23/17 1257 04/23/17 1553  TROPIPOC 0.02 0.17*     BNP Recent Labs Lab 04/23/17 1204  BNP 17.7     DDimer No results for input(s): DDIMER in the last 168 hours.   Radiology    Ct Head Wo Contrast  Result Date: 04/23/2017 CLINICAL DATA:  Unwitnessed fall.  Initial encounter. EXAM: CT HEAD WITHOUT CONTRAST TECHNIQUE: Contiguous axial images were obtained from the base of the skull through the vertex without intravenous contrast. COMPARISON:  None. FINDINGS: Brain: No swelling or acute hemorrhage suspected. Minimal thickened appearance of the inferior septum pellucidum on 3:16 is suspected volume averaging. No discrete hemorrhage at this site on reformats. No infarct, hydrocephalus, or mass. Vascular: Negative Skull: Scalp contusion at the  vertex. Negative for underlying fracture Sinuses/Orbits: Mild mucosal thickening in the paranasal sinuses. No hemosinus. IMPRESSION: Scalp contusion at the vertex without fracture. No acute intracranial finding. Electronically Signed   By: Marnee Spring M.D.   On: 04/23/2017 14:27   Ct Angio Chest Pe W And/or Wo Contrast  Result Date: 04/23/2017 CLINICAL DATA:  Syncopal episode today at work. EXAM: CT ANGIOGRAPHY CHEST WITH CONTRAST TECHNIQUE: Multidetector CT imaging of the chest was performed using the standard protocol during bolus administration of intravenous contrast. Multiplanar CT image reconstructions and MIPs were obtained to evaluate the vascular anatomy. CONTRAST:  100 cc of Isovue 370 COMPARISON:  Chest radiograph 02/07/2011. FINDINGS: Cardiovascular: The quality of this exam for evaluation of pulmonary embolism is good. No evidence of pulmonary embolism. Aortic atherosclerosis. Mild cardiomegaly. Multivessel coronary artery atherosclerosis. Mediastinum/Nodes: No mediastinal or hilar adenopathy. Lungs/Pleura: No pleural fluid.  Mild lingular volume loss. Upper Abdomen: Gallstones of up to 13 mm, without surrounding inflammation. Normal imaged portions of the liver, spleen, stomach, pancreas, adrenal glands, kidneys. Musculoskeletal: Hypoattenuating upper thoracic vertebral body lesions are likely hemangiomas and are tiny. Largest is in the T6 vertebral body, 8 mm. Review of the MIP images confirms the above findings. IMPRESSION: 1.  No evidence of pulmonary embolism. 2.  Coronary artery atherosclerosis. Aortic atherosclerosis. 3. Cholelithiasis. Electronically Signed   By: Jeronimo Greaves M.D.   On: 04/23/2017 17:03    Cardiac Studies   1. See LHC summary below 2. 2D echo pending  Patient Profile     66 y.o. male with history of BPH, GERD, prior possible autoimmune hepatitis, hyperlipidemia, kidney stones admitted with syncope, found to have newly diagnosed LBBB and elevated troponin. LHC  04/25/17: mild 2V CAD - 30% pRCA, 20% mRCA, 40% pLAD, 40% D1, LVEF 55%.  Assessment & Plan    1. Syncope/new LBBB - concerning for bradyarrhythmia given the ventricular standstill that appeared 04/23/17 after just one dose of very low dose carvedilol. Will ask EP to see today. Will keep NPO from now onward, awaiting their input regarding consideration of pacemaker. Metoprolol was started last night as part of medical therapy for CAD. Will discontinue.  2. Transient complete heart block after very low dose carvedilol - avoid further AVN blocking agents. Await EP input.  3. Elevated troponin, found to have nonobstructive CAD - suspect demand process. Continue ASA. Discontinue metoprolol started last night.  4. Hyperlipidemia -  Cannot titrate statin at  present time due to elevated LFTs (see #5).   5. Elevated LFTs - h/o autoimmune hepatitis. No recent LFTs to compare to. F/u in AM. Dr. Tresa EndoKelly recommended to consider adding Zetia.  6. Probable HTN - SBP mostly 140s-150s. Will await further recs regarding syncope before aggressively treating.  Note: will need DVT ppx if he remains inpatient and is not having any procedures today.  Signed, Laurann Montanaayna N Dunn, PA-C  04/25/2017, 8:47 AM

## 2017-04-25 NOTE — H&P (View-Only) (Signed)
ELECTROPHYSIOLOGY CONSULT NOTE    Patient ID: Frank Holloway MRN: [id number], DOB/AGE: [birth year] 66 y.o.  Admit date: 04/23/2017 Date of Consult: 04/25/2017  Primary Physician: Mickie Hillier, MD Primary Cardiologist: none  Reason for Consultation: syncope, CHB  HPI: [name] is a 66 y.o. male who is being seen today for the evaluation of syncope, CHB at the request of Dr. Duke Salvia.  PMHx include autoimmune hepatitis, hyperlipidemia, very recently his PMD noted elevated BP, but not started on medicines yet.  He had the a flu-like illness a couple weeks ago but resolved after only a couple days, and has been feeling very well.  He denies any CP, palpitations or SOB,no exertional intolerances.  No hx of syncope, rarely upon standing would get fleetingly lightheaded.  He was at work, had been at his desk a minute or 2 talking with coworkers, stood up, walked a few steps to punch in, was turning to walk away when he fainted.  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  He did not have any nausea, diaphoresis, no CP.  He woke feeling fine, but not sure what had happened.  Here he had not had any symptoms until this morning when he was ambulating to the BR felt briefly lightheaded and but not near syncopal, and has felt some light fluttering a couple times since then.  He is on no rate limiting drugs at home, here he got coreg 3.125 2 doses, last 04/23/17 @ 0800, and a dose of metoprolol tartrate yesterday @ 2144  LABS K+ 4.0 BUN/Creat 12.0.86 WBC 5.1 H/H 14/40 plts 155 AST 55 ALT 87 Coag wnl TSH 1/037    Past Medical History:  Diagnosis Date  . BPH (benign prostatic hyperplasia)   . 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 Gallstones   . GERD (gastroesophageal reflux disease)   . Hepatitis    probable  autoimmune  . Hyperlipidemia   . Kidney stones      Surgical History:  Past Surgical History:  Procedure Laterality Date  . COLONOSCOPY  2001, 2003  . LEFT HEART CATH AND CORONARY ANGIOGRAPHY N/A 04/24/2017   Procedure: Left Heart Cath and Coronary Angiography;  Surgeon: Lennette Bihari, MD;  Location: [facility] INVASIVE CV LAB;  Service: Cardiovascular;  Laterality: N/A;  . LIVER BIOPSY    . WISDOM TOOTH EXTRACTION       Prescriptions Prior to Admission  Medication Sig Dispense Refill Last Dose  . azaTHIOprine (IMURAN) 50 MG tablet Take 1 tablet by mouth daily.   04/23/2017 at Unknown time  . Calcium-Magnesium-Vitamin D (CALCIUM MAGNESIUM PO) Take 1 tablet by mouth daily.   04/23/2017 at Unknown time  . milk thistle 175 MG tablet Take 175 mg by mouth daily.   04/22/2017 at Unknown time  . Multiple Vitamins-Minerals (MULTIVITAMIN PO) Take 1 tablet by mouth daily.   04/23/2017 at Unknown time  . pravastatin (PRAVACHOL) 10 MG tablet Take 1 tablet by mouth daily.   04/22/2017 at Unknown time    Inpatient Medications:  . aspirin  81 mg Oral Daily  . azaTHIOprine  50 mg Oral Daily  . ezetimibe  10 mg Oral Daily  . lisinopril  10 mg Oral Daily  . multivitamin with minerals  1 tablet Oral Daily  . pravastatin  10 mg Oral Daily  . sodium chloride flush  3 mL Intravenous Q12H    Allergies:  Allergies  Allergen Reactions  . Lipitor [Atorvastatin]     Elevated liver function    Social History   Social History  . Marital status: Single    Spouse name: N/A  . Number of children: N/A  . Years of education: N/A   Occupational History  . 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 Smoking status: Never Smoker  . Smokeless tobacco: Never Used  . Alcohol use Yes     Comment: beer: 4 servings per week  . Drug use: No  . Sexual activity: Not on file   Other Topics Concern  . Not on file   Social History Narrative   Caffeine Use:  daily     Family History  Problem Relation Age of Onset  . Cirrhosis Father   . Breast cancer Mother   . Sarcoidosis Sister   . Leukemia    . Heart attack    . Sarcoidosis    . Asthma Maternal Grandmother      Review of Systems: All other systems reviewed and are otherwise negative except as noted above.  Physical Exam: Vitals:   04/24/17 1930 04/24/17 2000 04/24/17 2030 04/25/17 0610  BP: (!) 142/86 139/72 137/78 (!) 153/92  Pulse:  67 61 60  Resp:   16 16  Temp:   98.4 F (36.9 C) 98.5 F (36.9 C)  TempSrc:   Oral Oral  SpO2:  95% 98% 98%  Weight:    198 lb 3.1 oz (89.9 kg)  Height:        GEN- The patient is well appearing, alert and oriented x 3 today.   HEENT: normocephalic, atraumatic; sclera clear, conjunctiva pink; hearing intact; oropharynx clear; neck supple, no JVP Lymph- no cervical lymphadenopathy Lungs- CTA b/l, normal work of breathing.  No wheezes, rales, rhonchi Heart- RRR, no murmurs, rubs or gallops, PMI not laterally displaced GI- soft, non-tender, non-distended, bowel sounds present Extremities- no clubbing, cyanosis, or edema MS- no significant deformity or atrophy Skin- warm and dry, no rash or lesion Psych- euthymic mood, full affect Neuro- no gross deficits observed  Labs:   Lab Results  Component Value Date   WBC 5.1 04/25/2017   HGB 14.0 04/25/2017   HCT 40.8 04/25/2017   MCV 92.5 04/25/2017   PLT 155 04/25/2017    Recent Labs Lab 04/24/17 0630 04/25/17 0205  NA 139 139  K 4.1 4.0  CL 106 105  CO2 25 28  BUN 11 12  CREATININE 0.86 0.86  CALCIUM 9.2 8.8*  PROT 6.7  --   BILITOT 1.4*  --   ALKPHOS 42  --   ALT 87*  --   AST 55*  --   GLUCOSE 98 93      Radiology/Studies:  Ct Head Wo Contrast Result Date: 04/23/2017 CLINICAL DATA:  Unwitnessed fall.  Initial encounter. EXAM: CT HEAD WITHOUT CONTRAST TECHNIQUE: Contiguous axial images were obtained from the base of the skull through the vertex without intravenous  contrast. COMPARISON:  None. FINDINGS: Brain: No swelling or acute hemorrhage suspected. Minimal thickened appearance of the inferior septum pellucidum on 3:16 is suspected volume averaging. No discrete hemorrhage at this site on reformats. No infarct, hydrocephalus, or mass. Vascular: Negative Skull: Scalp contusion at the vertex. Negative for underlying fracture Sinuses/Orbits: Mild mucosal thickening in the paranasal  sinuses. No hemosinus. IMPRESSION: Scalp contusion at the vertex without fracture. No acute intracranial finding. Electronically Signed   By: Marnee Spring M.D.   On: 04/23/2017 14:27   Ct Angio Chest Pe W And/or Wo Contrast Result Date: 04/23/2017 CLINICAL DATA:  Syncopal episode today at work. EXAM: CT ANGIOGRAPHY CHEST WITH CONTRAST TECHNIQUE: Multidetector CT imaging of the chest was performed using the standard protocol during bolus administration of intravenous contrast. Multiplanar CT image reconstructions and MIPs were obtained to evaluate the vascular anatomy. CONTRAST:  100 cc of Isovue 370 COMPARISON:  Chest radiograph 02/07/2011. FINDINGS: Cardiovascular: The quality of this exam for evaluation of pulmonary embolism is good. No evidence of pulmonary embolism. Aortic atherosclerosis. Mild cardiomegaly. Multivessel coronary artery atherosclerosis. Mediastinum/Nodes: No mediastinal or hilar adenopathy. Lungs/Pleura: No pleural fluid.  Mild lingular volume loss. Upper Abdomen: Gallstones of up to 13 mm, without surrounding inflammation. Normal imaged portions of the liver, spleen, stomach, pancreas, adrenal glands, kidneys. Musculoskeletal: Hypoattenuating upper thoracic vertebral body lesions are likely hemangiomas and are tiny. Largest is in the T6 vertebral body, 8 mm. Review of the MIP images confirms the above findings. IMPRESSION: 1.  No evidence of pulmonary embolism. 2.  Coronary artery atherosclerosis. Aortic atherosclerosis. 3. Cholelithiasis. Electronically Signed   By: Jeronimo Greaves M.D.   On: 04/23/2017 17:03    EKG: SR, 68bpm, LBBB, borderline 1st degree AVblock TELEMETRY: SR, occ PVC's, 3-4 second CHB episode yesterday, this morning 11 second pause w/CHB >> AFlutter 70's>> SR  04/24/17: LHC Conclusion   Prox RCA lesion, 30 %stenosed.  Mid RCA lesion, 20 %stenosed.  Prox LAD lesion, 40 %stenosed.  Ost 1st Diag to 1st Diag lesion, 40 %stenosed.   Mild two vessel CAD with evidence for mild coronary calcification of the LAD with 40% proximal diagonal stenosis and 40% LAD stenosis after the diagonal vessel; normal left circumflex coronary artery; and 30 in 20% proximal and mid RCA stenoses.  Normal LV function without focal segmental wall motion abnormalities.  Ejection fraction 55%.  RECOMMENDATION; The patient has recently noticed exertional angina.  He will be started on low-dose beta blocker therapy and aspirin.  He has a history of LFT elevation which may preclude more aggressive statin therapy above his current low dose pravastatin 10 mg.  Consider adding Zetia 10 mg for additional LDL lowering to allow for improved plaque stability and hopefully regression.     Assessment and Plan:   1. Syncope     Abrupt without warning     LBBB on his presenting EKG and abnormal Trop prompted cath with no obstructive disease and normal LVEF     He was initially rx ASA, BB and heparin with concerns of CAD w/coronary Ca++ on CT     Post cath coreg changed to lopressor     Overnight  observed to have 2 CHB episodes with up to 5 seconds and BB stopped     This morning 11 second pause associated with CHB, >> AFlutter 70's and subsequently back in SR 70's  He did get some BB here, thogh he presented with syncope that very much sounds of heartblock not on any rate limiting/nodal blocking drugs.  He has also had brief AFlutter.  Would be unable to use rate controlling meds if needed.  I think he will need a pacemaker, discussed this with the patient, risks/benefits,  and he is agreeable to proceed.  Dr. Johney Frame to see.   2. New PAFib     CHA2DS2Vasc is  2 with new HTN     Was of very brief suration, +/- an hour     For now, no a/c      3. HTN     On ACE here   Signed, Francis Dowse, PA-C 04/25/2017 11:35 AM  I have seen, examined the patient, and reviewed the above assessment and plan.  On exam, RRR.  Changes to above are made where necessary.  Pt with  Mobitz II second degree AV block and transient CHB with syncope.  His conduction system disease precedes his admission and was associated with syncope.  There are not reversible causes.  I would therefore recommend pacemaker implantation at this time.  Risks, benefits, alternatives to pacemaker implantation were discussed in detail with the patient today. The patient understands that the risks include but are not limited to bleeding, infection, pneumothorax, perforation, tamponade, vascular damage, renal failure, MI, stroke, death,  and lead dislodgement and wishes to proceed. We will therefore schedule the procedure at the next available time today.   Co Sign: Hillis Range, MD 04/25/2017 1:25 PM

## 2017-04-26 ENCOUNTER — Encounter (HOSPITAL_COMMUNITY): Payer: Self-pay | Admitting: Internal Medicine

## 2017-04-26 ENCOUNTER — Inpatient Hospital Stay (HOSPITAL_COMMUNITY): Payer: BLUE CROSS/BLUE SHIELD

## 2017-04-26 LAB — BASIC METABOLIC PANEL
Anion gap: 10 (ref 5–15)
BUN: 11 mg/dL (ref 6–20)
CALCIUM: 8.9 mg/dL (ref 8.9–10.3)
CHLORIDE: 103 mmol/L (ref 101–111)
CO2: 25 mmol/L (ref 22–32)
CREATININE: 0.82 mg/dL (ref 0.61–1.24)
Glucose, Bld: 96 mg/dL (ref 65–99)
Potassium: 3.6 mmol/L (ref 3.5–5.1)
SODIUM: 138 mmol/L (ref 135–145)

## 2017-04-26 MED ORDER — YOU HAVE A PACEMAKER BOOK
Freq: Once | Status: AC
  Administered 2017-04-26: 07:00:00
  Filled 2017-04-26: qty 1

## 2017-04-26 MED ORDER — LISINOPRIL 10 MG PO TABS: 10.0000 mg | ORAL_TABLET | Freq: Every day | ORAL | 3 refills | Status: DC

## 2017-04-26 NOTE — Care Management Note (Signed)
Case Management Note  Patient Details  Name: Johnathan HausenKelly Dziuba MRN: [id number] Date of Birth: 12-21-1951  Subjective/Objective:    Pt admitted on 04/23/17 with syncope/flutter.  PTA, pt independent, lives alone, but has brother and sister and law within a mile of his home.                  Action/Plan: Pt for discharge home today.  He denies any home needs.    Expected Discharge Date:  04/26/17               Expected Discharge Plan:  Home/Self Care  In-House Referral:     Discharge planning Services  CM Consult  Post Acute Care Choice:    Choice offered to:     DME Arranged:    DME Agency:     HH Arranged:    HH Agency:     Status of Service:  Completed, signed off  If discussed at [employer] Length of Stay Meetings, dates discussed:    Additional Comments:  Glennon Macmerson, Jasmene Goswami M, RN 04/26/2017, 11:20 AM

## 2017-04-26 NOTE — Progress Notes (Signed)
Pt discharged per MD order. Iv removed.  Telemetry d/c, CCMD notified.  AVS went over with patient including prescriptions to pharmacy, follow up appointments and wound care.  No questions at this time.  Minerva Endsiffany N Bhargav Barbaro RN

## 2017-04-26 NOTE — Discharge Summary (Signed)
ELECTROPHYSIOLOGY PROCEDURE DISCHARGE SUMMARY    Patient ID: Frank Holloway Dimaria,  MRN: [id number], DOB/AGE: 680-Nov-1952 66 y.o.  Admit date: 04/23/2017 Discharge date: 04/26/2017  Primary Care Physician: Mickie HillierLITTLE,KEVIN LORNE, MD  Primary Cardiologist: new [facility], Dr. Duke Salviaandolph Electrophysiologist: new this admission, Dr. Johney FrameAllred  Primary Discharge Diagnosis:  1. Syncope 2. High degree AVblock, including CHB 3. HTN  Secondary Discharge Diagnosis:  1. Autoimmune hepatitis 2. HLD  Allergies  Allergen Reactions  . Lipitor [Atorvastatin]     Elevated liver function     Procedures This Admission:  1. 04/24/17: LHC, Dr. Tresa EndoKelly Conclusion    Prox RCA lesion, 30 %stenosed.  Mid RCA lesion, 20 %stenosed.  Prox LAD lesion, 40 %stenosed.  Ost 1st Diag to 1st Diag lesion, 40 %stenosed.  2.  Implantation of a SJM dual chamber PPM on 04/25/17 by Dr Johney FrameAllred.  The patient received a [employer] Assurity MRI model [id number] (serial number  [id number]) pacemaker, St Jude Medical Tendril MRI model LPA1200M- 52 (serial number  [id number]) right atrial lead and a St Jude Medical Tendril MRI model [id number]  (serial number  [id number]) right ventricular lead  There were no immediate post procedure complications. 3.  CXR on 04/26/17 demonstrated no pneumothorax status post device implantation.   Brief HPI: Frank Holloway Christofferson is a 66 y.o. male [facility] after a syncopal event at work.  Hospital Course:  The patient has PMHx as noted above, he was admitted, reported that he was at work, had been at his desk a minute or 2 talking with coworkers, stood up, walked a few steps to punch in, was turning to walk away when he fainted.  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  He did not have any nausea, diaphoresis, no CP.  He woke  feeling fine, but not sure what had happened.  Here he had not had any symptoms until this morning when he was ambulating to the BR felt briefly lightheaded and but not near syncopal, and has felt some light fluttering a couple times since then.   He had LBBB on his presenting EKG and abnormal Trop prompted cath with no obstructive disease and normal LVEF.  He was initially rx ASA, BB and heparin with concerns of CAD w/coronary Ca++ on CT, post cath coreg changed to lopressor.  Overnight  observed to have 2 CHB episodes with up to 5 seconds as well as intermittent brioef periods of Mobitz II block, and his BB stopped, he then was observed to have an 11 second pause associated with CHB, >> AFlutter 70's and subsequently back in SR 70's. He underwent implantation of a PPM with details as outlined above.   He had only about an hour of AFlutter, we will monitor this via his device, should he have more will need to consider a/c.  His echo noted LVEF 55-60%, no significant VHD.   He was monitored on telemetry overnight which demonstrated SR, intermittent A and V pacing.  Left chest was without hematoma or ecchymosis.  The device was interrogated and found to be functioning normally.  CXR was obtained and demonstrated no pneumothorax status post device implantation.  Wound care, arm mobility, and restrictions were reviewed with the patient.  We will continue lisinopril for HTN management,  he is counseled to c/w his statin and PMD for management of his cholesterol/HTN, as well as exercise (when able post procedure/implant) and healthy diet. The patient was examined by Dr. Johney Frame and considered stable for discharge to home.    Physical Exam: Vitals:   04/25/17 1525 04/25/17 1530 04/25/17 1944 04/26/17 0546  BP: (!) 153/107 (!) 171/108 126/77 131/80  Pulse: 66 67 85 63  Resp: 14 12 16 18   Temp:   98.4 F (36.9 C) 98.1 F (36.7 C)  TempSrc:   Oral Oral  SpO2: 98% 97% 96% 96%  Weight:      Height:        GEN-  The patient is well appearing, alert and oriented x 3 today.   HEENT: normocephalic, atraumatic; sclera clear, conjunctiva pink; hearing intact; oropharynx clear; neck supple, no JVP Lungs- CTA b/l, normal work of breathing.  No wheezes, rales, rhonchi Heart- RRR, no murmurs, rubs or gallops, PMI not laterally displaced GI- soft, non-tender, non-distended Extremities- no clubbing, cyanosis, or edema MS- no significant deformity or atrophy Skin- warm and dry, no rash or lesion, left chest without hematoma/ecchymosis Psych- euthymic mood, full affect Neuro- no gross deficits   Labs:   Lab Results  Component Value Date   WBC 5.1 04/25/2017   HGB 14.0 04/25/2017   HCT 40.8 04/25/2017   MCV 92.5 04/25/2017   PLT 155 04/25/2017    Recent Labs Lab 04/24/17 0630  04/26/17 0308  NA 139  < > 138  K 4.1  < > 3.6  CL 106  < > 103  CO2 25  < > 25  BUN 11  < > 11  CREATININE 0.86  < > 0.82  CALCIUM 9.2  < > 8.9  PROT 6.7  --   --   BILITOT 1.4*  --   --   ALKPHOS 42  --   --   ALT 87*  --   --   AST 55*  --   --   GLUCOSE 98  < > 96  < > = values in this interval not displayed.  Discharge Medications:  Allergies as of 04/26/2017      Reactions   Lipitor [atorvastatin]    Elevated liver function      Medication List    TAKE these medications   azaTHIOprine 50 MG tablet Commonly known as:  IMURAN Take 1 tablet by mouth daily.   CALCIUM MAGNESIUM PO Take 1 tablet by mouth daily.   lisinopril 10 MG tablet Commonly known as:  PRINIVIL,ZESTRIL Take 1 tablet (10 mg total) by mouth daily.   milk thistle 175 MG tablet Take 175 mg by mouth daily.   MULTIVITAMIN PO Take 1 tablet by mouth daily.   pravastatin 10 MG tablet Commonly known as:  PRAVACHOL Take 1 tablet by mouth daily.       Disposition: Home Discharge Instructions    Diet - low sodium heart healthy    Complete by:  As directed    Increase activity slowly    Complete by:  As directed      Follow-up  Information    [facility] Office Follow up on 05/07/2017.   Specialty:  Cardiology Why:  10:00AM, wound check Contact information: [address], [address] [place] [address] [phone]       Hillis Range, MD Follow up on 07/29/2017.   Specialty:  Cardiology Why:  9:15AM Contact information: [address]  [place] [address] [phone]        Mickie Hillier, MD Follow up.   Specialty:  Family Medicine Why:  see your primary doctor in the next 2-3 weeks to follow up with your blood pressure and cholesterol management Contact information: [address] [place] [address] [phone]           Duration of Discharge Encounter: Greater than 30 minutes including physician time.  SignedFrancis Dowse, PA-C 04/26/2017 9:43 AM   I have seen, examined the patient, and reviewed the above assessment and plan.  Changes to above are made where necessary.  Device interrogation is reviewed and normal.  CXR reveals stable leads and no ptx.  Routine wound care and follow-up scheduled.  Co Sign: Hillis Range, MD 04/26/2017 11:38 PM

## 2017-04-26 NOTE — Discharge Instructions (Signed)
° ° °  R wrist/cath procedure site instructions  No lifting over 5 lbs for 1 week. No vigorous or sexual activity for 1 week.  Keep procedure site clean & dry. If you notice increased pain, swelling, bleeding or pus, call/return!  You may shower, but no soaking baths/hot tubs/pools for 1 week.      Supplemental Discharge Instructions for  Pacemaker/Defibrillator Patients  Activity No heavy lifting or vigorous activity with your left/right arm for 6 to 8 weeks.  Do not raise your left/right arm above your head for one week.  Gradually raise your affected arm as drawn below.             04/29/17                       04/30/17                       05/01/17                     05/02/17 __  NO DRIVING for  1 week   ; you may begin driving on   1/61/095/10/18  .  WOUND CARE - Keep the wound area clean and dry.  Do not get this area wet, no showers for one week; you may shower on once cleared to at your wound check visit . - The tape/steri-strips on your wound will fall off; do not pull them off.  No bandage is needed on the site.  DO  NOT apply any creams, oils, or ointments to the wound area. - If you notice any drainage or discharge from the wound, any swelling or bruising at the site, or you develop a fever > 101? F after you are discharged home, call the office at once.  Special Instructions - You are still able to use cellular telephones; use the ear opposite the side where you have your pacemaker/defibrillator.  Avoid carrying your cellular phone near your device. - When traveling through airports, show security personnel your identification card to avoid being screened in the metal detectors.  Ask the security personnel to use the hand wand. - Avoid arc welding equipment, MRI testing (magnetic resonance imaging), TENS units (transcutaneous nerve stimulators).  Call the office for questions about other devices. - Avoid electrical appliances that are in poor condition or are not properly  grounded. - Microwave ovens are safe to be near or to operate.  Additional information for defibrillator patients should your device go off: - If your device goes off ONCE and you feel fine afterward, notify the device clinic nurses. - If your device goes off ONCE and you do not feel well afterward, call 911. - If your device goes off TWICE, call 911. - If your device goes off THREE times in one day, call 911.  DO NOT DRIVE YOURSELF OR A FAMILY MEMBER WITH A DEFIBRILLATOR TO THE HOSPITAL--CALL 911.

## 2017-04-30 ENCOUNTER — Telehealth: Payer: Self-pay | Admitting: Internal Medicine

## 2017-04-30 NOTE — Telephone Encounter (Signed)
Discussed with Di Kindlehris B NP and will have patient hold his Lisinopril, monitor blood pressure and call back next week with update. Advised patient, verbalized understanding.   Also patient stated he needed a note from office that he needed to be out of work until 05/02/17. He would like to go back then if he is not lightheaded but if he continues he may need a few more days. Patient stated he should be getting email from HR tonight and will call back tomorrow with specifics for out of work note.

## 2017-04-30 NOTE — Telephone Encounter (Signed)
New message   Pt is calling.   Pt c/o medication issue:  1. Name of Medication: lisinopril 10 mg  2. How are you currently taking this medication (dosage and times per day)? Once daily-in the morning.   3. Are you having a reaction (difficulty breathing--STAT)? lightheadedness  4. What is your medication issue? Pt is calling to ask if it is normal to have lightheadedness when starting this medication. He said the past 3 days he has been experiencing it.

## 2017-04-30 NOTE — Telephone Encounter (Signed)
Returned call to patient-patient reports lightheadedness since 5/4 (AM of discharge).  Reports he has noticed this since starting lisinopril.  Denies near syncope, CP, SOB, palpitations.  Reports he feels different than prior to the "event" he had.   He is lightheaded but not so severe he is going to pass out.  Reports his son in law and stepson are physicians and recommended he call to inform us.  Reports he notices the lightheadedness is worse a couple hours after his AM dose of lisinopril.  Patient is unable to take BP at home, advised to get BP cuff if possible as this would be very helpful. Lightheadedness is increased with movement.   Reports minor lightheadedness at current.    Advised I would make MD aware as seek further recommendations/advice.  Patient aware and verbalized understanding.  Patient verbalized he will get BP cuff and start monitoring BP and HR.

## 2017-05-02 ENCOUNTER — Telehealth: Payer: Self-pay | Admitting: Internal Medicine

## 2017-05-02 NOTE — Telephone Encounter (Signed)
Returned call to patient-patient requesting changes to work note.  Work note completed and refaxed to # provided.    Patient aware and verbalized understanding.

## 2017-05-02 NOTE — Telephone Encounter (Signed)
New message    Pt is calling stating he needs a note from the days he was in the hospital. He says the note can be sent to the HR department at his job. Fax-[phone] [phone] Attention Amada KingfisherSusan Petty.

## 2017-05-02 NOTE — Telephone Encounter (Signed)
Spoke with patient-requesting letter stating he was under a doctors care for time he was in the hospital.  Will create letter and fax to # provided.    Patient also states he continues to be lightheaded, especially with exercise.  Is currently holding lisinopril as directed and BP readings are as follows:  8th PM 104/58 66  9th  AM 104/70 99 AM 120/73 66 AM 101/67 79 PM 101/76 110 PM 107/74 68  10th AM 101/72 73 AM 117/62 66  Advised to continue current plan and to stay hydrated.  Advised I continues to be symptomatic will need to be seen next week.  Wound check on Tuesday 5/15-pt aware.     Patient verbalized understanding.

## 2017-05-02 NOTE — Telephone Encounter (Signed)
Follow UP;   Please call him,concerning his note for work.

## 2017-05-07 ENCOUNTER — Ambulatory Visit (INDEPENDENT_AMBULATORY_CARE_PROVIDER_SITE_OTHER): Payer: BLUE CROSS/BLUE SHIELD | Admitting: *Deleted

## 2017-05-07 DIAGNOSIS — I442 Atrioventricular block, complete: Secondary | ICD-10-CM

## 2017-05-07 LAB — CUP PACEART INCLINIC DEVICE CHECK
Battery Voltage: 3.13 V
Brady Statistic RA Percent Paced: 18 %
Date Time Interrogation Session: 20180515141221
Implantable Lead Implant Date: 20180503
Lead Channel Impedance Value: 562.5 Ohm
Lead Channel Pacing Threshold Amplitude: 0.5 V
Lead Channel Pacing Threshold Amplitude: 0.5 V
Lead Channel Pacing Threshold Amplitude: 0.75 V
Lead Channel Pacing Threshold Pulse Width: 0.5 ms
Lead Channel Setting Sensing Sensitivity: 2 mV
MDC IDC LEAD IMPLANT DT: 20180503
MDC IDC LEAD LOCATION: 753859
MDC IDC LEAD LOCATION: 753860
MDC IDC MSMT LEADCHNL RA IMPEDANCE VALUE: 537.5 Ohm
MDC IDC MSMT LEADCHNL RA PACING THRESHOLD PULSEWIDTH: 0.5 ms
MDC IDC MSMT LEADCHNL RA SENSING INTR AMPL: 3.7 mV
MDC IDC MSMT LEADCHNL RV PACING THRESHOLD AMPLITUDE: 0.75 V
MDC IDC MSMT LEADCHNL RV PACING THRESHOLD PULSEWIDTH: 0.5 ms
MDC IDC MSMT LEADCHNL RV PACING THRESHOLD PULSEWIDTH: 0.5 ms
MDC IDC MSMT LEADCHNL RV SENSING INTR AMPL: 6.6 mV
MDC IDC PG IMPLANT DT: 20180503
MDC IDC SET LEADCHNL RA PACING AMPLITUDE: 3.5 V
MDC IDC SET LEADCHNL RV PACING AMPLITUDE: 1 V
MDC IDC SET LEADCHNL RV PACING PULSEWIDTH: 0.5 ms
MDC IDC STAT BRADY RV PERCENT PACED: 31 %
Pulse Gen Model: 2272
Pulse Gen Serial Number: 8904055

## 2017-05-07 NOTE — Progress Notes (Signed)
Wound check appointment. Steri-strips removed. Wound without redness or edema. R corner of incision superficially un-approximated, dressed with steri strips. Patient was advised to remove strips after 3 days and call if he notices any redness, drainage, or erythema. Overall site well healed.  Pacemaker checked in clinic. Normal device function. Thresholds, sensing, and impedances consistent with implant measurements. Device programmed at 3.5V in RA for extra safety margin until 3 month visit, RV autocap programmed on at implant. Histogram distribution appropriate for patient and level of activity. No mode switches or high ventricular rates noted. (64) episodes of PMT noted. VA conduction noted at , PVARP increased to , MTR/MSR decreased to 100bpm, ok per AS. Patient educated about wound care, arm mobility, lifting restrictions. ROV in 6 weeks w/AS and 3 months with JA/E.

## 2017-06-16 NOTE — Progress Notes (Signed)
Electrophysiology Office Note Date: 06/20/2017  ID:  Frank Holloway, DOB Apr 24, 1951, MRN [id number]  PCP: Catha Gosselin, MD Electrophysiologist: Johney Frame  CC: Pacemaker follow-up  Frank Holloway is a 66 y.o. male seen today for Dr Johney Frame.  He presents today for routine electrophysiology followup.  Since last being seen in our clinic, the patient reports doing very well.  He denies chest pain, palpitations, dyspnea, PND, orthopnea, nausea, vomiting, dizziness, syncope, edema, weight gain, or early satiety. He works at a [employer] and is walking most of the day without chest pain or shortness of breath. He does have increased cardiac awareness since his pacemaker was placed.   Device History: STJ dual chamber PPM implanted 2018 for complete heart block    Past Medical History:  Diagnosis Date  . BPH (benign prostatic hyperplasia)   . Complete heart block (HCC) 04/24/2017   a. s/p STJ dual chamber PPM   . Gallstones   . GERD (gastroesophageal reflux disease)   . 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 Hyperlipidemia   . Kidney stones   . Paroxysmal atrial fibrillation (HCC)    a. nonsustained and seen during hospitalization 2018   Past Surgical History:  Procedure Laterality Date  . COLONOSCOPY  2001, 2003  . LEFT HEART CATH AND CORONARY ANGIOGRAPHY N/A 04/24/2017   Procedure: Left Heart Cath and Coronary Angiography;  Surgeon: Lennette Bihari, MD;  Location: [facility] INVASIVE CV LAB;  Service: Cardiovascular;  Laterality: N/A;  . LIVER BIOPSY    . PACEMAKER IMPLANT N/A 04/25/2017   STJ dual chamber PPM implanted by Dr Johney Frame for complete heart block   . WISDOM TOOTH EXTRACTION      Current Outpatient Prescriptions  Medication Sig Dispense Refill  . azaTHIOprine (IMURAN) 50 MG tablet Take 0.5 tablets by mouth daily.     . Calcium-Magnesium-Vitamin D (CALCIUM MAGNESIUM PO) Take 1 tablet by mouth daily.    . milk thistle 175 MG tablet Take 175 mg by mouth daily.    . Multiple  Vitamins-Minerals (MULTIVITAMIN PO) Take 1 tablet by mouth daily.    . pravastatin (PRAVACHOL) 10 MG tablet Take 1 tablet by mouth daily.     No current facility-administered medications for this visit.     Allergies:   Lipitor [atorvastatin]   Social History: Social History   Social History  . Marital status: Single    Spouse name: N/A  . Number of children: N/A  . Years of education: N/A   Occupational History  . car sales [employer]   Social History Main Topics  . Smoking status: Never Smoker  . Smokeless tobacco: Never Used  . Alcohol use Yes     Comment: beer: 4 servings per week  . Drug use: No  . Sexual activity: Not on file   Other Topics Concern  . Not on file   Social History Narrative   Caffeine Use: daily    Family History: Family History  Problem Relation Age of Onset  . Cirrhosis Father   . Breast cancer Mother   . Sarcoidosis Sister   . Leukemia Unknown   . Heart attack Unknown   . Sarcoidosis Unknown   . Asthma Maternal Grandmother      Review of Systems: All other systems reviewed and are otherwise negative except as noted above.   Physical Exam: VS:  BP 122/64   Pulse 64   Ht 5\' 11"  (1.803 m)  Wt 195 lb (88.5 kg)   BMI 27.20 kg/m  , BMI Body mass index is 27.2 kg/m.  GEN- The patient is well appearing, alert and oriented x 3 today.   HEENT: normocephalic, atraumatic; sclera clear, conjunctiva pink; hearing intact; oropharynx clear; neck supple  Lungs- Clear to ausculation bilaterally, normal work of breathing.  No wheezes, rales, rhonchi Heart- Regular rate and rhythm  GI- soft, non-tender, non-distended, bowel sounds present  Extremities- no clubbing, cyanosis, or edema  MS- no significant deformity or atrophy Skin- warm and dry, no rash or lesion; PPM pocket well healed Psych- euthymic mood, full affect Neuro- strength and sensation are intact  PPM Interrogation- reviewed  in detail today,  See PACEART report  EKG:  EKG is not ordered today.  Recent Labs: 04/23/2017: B Natriuretic Peptide 17.7; Magnesium 2.1; TSH 1.037 04/24/2017: ALT 87 04/25/2017: Hemoglobin 14.0; Platelets 155 04/26/2017: BUN 11; Creatinine, Ser 0.82; Potassium 3.6; Sodium 138   Wt Readings from Last 3 Encounters:  06/20/17 195 lb (88.5 kg)  04/25/17 198 lb 3.1 oz (89.9 kg)  10/22/14 196 lb 8 oz (89.1 kg)     Other studies Reviewed: Additional studies/ records that were reviewed today include: hospital records   Assessment and Plan:  1.  Complete heart block  Normal PPM function See Pace Art report No changes today  2.  PMT No recurrence since early June Pt tolerating MTR at 100bpm  3.  Paroxysmal atrial fibrillation Seen during admission Non-sustained No recurrence by device interrogation today, will follow CHADS2VASC is 1   Current medicines are reviewed at length with the patient today.   The patient does not have concerns regarding his medicines.  The following changes were made today:  none  Labs/ tests ordered today include: none No orders of the defined types were placed in this encounter.    Disposition:   Follow up with Dr Johney FrameAllred as scheduled     Signed, Gypsy BalsamAmber Amita Atayde, NP 06/20/2017 8:41 AM  [facility] [address] [place] [address] [phone] (office) [phone] (fax)

## 2017-06-18 ENCOUNTER — Encounter: Payer: Self-pay | Admitting: Nurse Practitioner

## 2017-06-20 ENCOUNTER — Ambulatory Visit (INDEPENDENT_AMBULATORY_CARE_PROVIDER_SITE_OTHER): Payer: BLUE CROSS/BLUE SHIELD | Admitting: Nurse Practitioner

## 2017-06-20 ENCOUNTER — Encounter: Payer: Self-pay | Admitting: Nurse Practitioner

## 2017-06-20 VITALS — BP 122/64 | HR 64 | Ht 71.0 in | Wt 195.0 lb

## 2017-06-20 DIAGNOSIS — I499 Cardiac arrhythmia, unspecified: Secondary | ICD-10-CM

## 2017-06-20 DIAGNOSIS — I442 Atrioventricular block, complete: Secondary | ICD-10-CM | POA: Diagnosis not present

## 2017-06-20 DIAGNOSIS — I480 Paroxysmal atrial fibrillation: Secondary | ICD-10-CM

## 2017-06-20 LAB — CUP PACEART INCLINIC DEVICE CHECK
Implantable Lead Implant Date: 20180503
Implantable Lead Location: 753859
Implantable Pulse Generator Implant Date: 20180503
MDC IDC LEAD IMPLANT DT: 20180503
MDC IDC LEAD LOCATION: 753860
MDC IDC SESS DTM: 20180628084846
Pulse Gen Model: 2272
Pulse Gen Serial Number: 8904055

## 2017-06-20 NOTE — Patient Instructions (Signed)
Medication Instructions:  Your physician recommends that you continue on your current medications as directed. Please refer to the Current Medication list given to you today.   Labwork: None Ordered   Testing/Procedures: None Ordered   Follow-Up: Your physician recommends that you schedule a follow-up appointment in: with Dr. Johney FrameAllred as scheduled.  Any Other Special Instructions Will Be Listed Below (If Applicable).     If you need a refill on your cardiac medications before your next appointment, please call your pharmacy.

## 2017-07-29 ENCOUNTER — Ambulatory Visit (INDEPENDENT_AMBULATORY_CARE_PROVIDER_SITE_OTHER): Payer: BLUE CROSS/BLUE SHIELD | Admitting: Internal Medicine

## 2017-07-29 ENCOUNTER — Encounter: Payer: Self-pay | Admitting: Internal Medicine

## 2017-07-29 VITALS — BP 122/84 | HR 61 | Ht 71.0 in | Wt 194.8 lb

## 2017-07-29 DIAGNOSIS — I499 Cardiac arrhythmia, unspecified: Secondary | ICD-10-CM | POA: Diagnosis not present

## 2017-07-29 DIAGNOSIS — I480 Paroxysmal atrial fibrillation: Secondary | ICD-10-CM

## 2017-07-29 DIAGNOSIS — I442 Atrioventricular block, complete: Secondary | ICD-10-CM

## 2017-07-29 NOTE — Progress Notes (Signed)
    PCP: Catha GosselinLittle, Kevin, MD  Primary EP:  Dr Gomez CleverlyAllred  Frank Holloway is a 66 y.o. male who presents today for routine electrophysiology followup.  Since his PPM implant, the patient reports doing very well.  Today, he denies symptoms of palpitations, chest pain, shortness of breath,  lower extremity edema, dizziness, presyncope, or syncope.  The patient is otherwise without complaint today.  He is getting remarried to his second wife and is moving to [place] later this month.  He already has plans to establish for cardiology care there.  Past Medical History:  Diagnosis Date  . BPH (benign prostatic hyperplasia)   . Complete heart block (HCC) 04/24/2017   a. s/p STJ dual chamber PPM   . Gallstones   . GERD (gastroesophageal reflux disease)   . 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 Hyperlipidemia   . Kidney stones   . Paroxysmal atrial fibrillation (HCC)    a. nonsustained and seen during hospitalization 2018   Past Surgical History:  Procedure Laterality Date  . COLONOSCOPY  2001, 2003  . LEFT HEART CATH AND CORONARY ANGIOGRAPHY N/A 04/24/2017   Procedure: Left Heart Cath and Coronary Angiography;  Surgeon: Lennette Biharihomas A Massimo, MD;  [facility];  Service: Cardiovascular;  Laterality: N/A;  . LIVER BIOPSY    . PACEMAKER IMPLANT N/A 04/25/2017   STJ dual chamber PPM implanted by Dr Johney FrameAllred for complete heart block   . WISDOM TOOTH EXTRACTION      ROS- all systems are reviewed and negative except as per HPI above  Current Outpatient Prescriptions  Medication Sig Dispense Refill  . azaTHIOprine (IMURAN) 50 MG tablet Take 0.5 tablets by mouth daily.     . Calcium-Magnesium-Vitamin D (CALCIUM MAGNESIUM PO) Take 1 tablet by mouth daily.    . milk thistle 175 MG tablet Take 175 mg by mouth daily.    . Multiple Vitamins-Minerals (MULTIVITAMIN PO) Take 1 tablet by mouth daily.    . pravastatin (PRAVACHOL) 10 MG tablet Take 1 tablet by mouth daily.     No current  facility-administered medications for this visit.     Physical Exam: Vitals:   07/29/17 0933  BP: 122/84  Pulse: 61  SpO2: 96%  Weight: 194 lb 12.8 oz (88.4 kg)  Height: 5\' 11"  (1.803 m)    GEN- The patient is well appearing, alert and oriented x 3 today.   Head- normocephalic, atraumatic Eyes-  Sclera clear, conjunctiva pink Ears- hearing intact Oropharynx- clear Lungs- Clear to ausculation bilaterally, normal work of breathing Chest- pacemaker pocket is well healed Heart- Regular rate and rhythm, no murmurs, rubs or gallops, PMI not laterally displaced GI- soft, NT, ND, + BS Extremities- no clubbing, cyanosis, or edema  Pacemaker interrogation- reviewed in detail today,  See PACEART report  ekg tracing ordered today is personally reviewed and shows atrial paced rhythm, LBBB  Assessment and Plan:  1. Transient complete heart block with syncope Normal pacemaker function See Pace Art report No changes today Syncope has resolved  2. Paroxysmal afib Nonsustained episodes seen during PPM implant hospitalization No further episodes since June 28 interrogation Will follow clinically chads2vasc score is 1  3. PMT VA conduction is about 400 msec No changes today  Merlin He will establish care in [place] when he moves later this month Per his request, I have not scheduled follow-up here  Hillis RangeJames Dedee Liss MD, [facility] 07/29/2017 9:47 AM

## 2017-07-29 NOTE — Patient Instructions (Signed)
Medication Instructions:  Your physician recommends that you continue on your current medications as directed. Please refer to the Current Medication list given to you today.  Labwork: None ordered.  Testing/Procedures: None ordered.  Follow-Up: Your physician recommends that you schedule a follow-up appointment as needed.   Any Other Special Instructions Will Be Listed Below (If Applicable).     If you need a refill on your cardiac medications before your next appointment, please call your pharmacy.   

## 2017-07-31 LAB — CUP PACEART INCLINIC DEVICE CHECK
Battery Remaining Longevity: 109 mo
Battery Remaining Percentage: 95 %
Battery Voltage: 3.01 V
Brady Statistic RA Percent Paced: 27 %
Implantable Lead Implant Date: 20180503
Implantable Lead Location: 753860
Implantable Pulse Generator Implant Date: 20180503
Lead Channel Impedance Value: 480 Ohm
Lead Channel Pacing Threshold Amplitude: 0.75 V
Lead Channel Pacing Threshold Pulse Width: 0.5 ms
Lead Channel Pacing Threshold Pulse Width: 0.5 ms
Lead Channel Setting Pacing Amplitude: 2 V
Lead Channel Setting Pacing Amplitude: 2.5 V
Lead Channel Setting Pacing Pulse Width: 0.5 ms
Lead Channel Setting Sensing Sensitivity: 2 mV
MDC IDC LEAD IMPLANT DT: 20180503
MDC IDC LEAD LOCATION: 753859
MDC IDC MSMT LEADCHNL RA PACING THRESHOLD AMPLITUDE: 0.5 V
MDC IDC MSMT LEADCHNL RA SENSING INTR AMPL: 3.6 mV
MDC IDC MSMT LEADCHNL RV IMPEDANCE VALUE: 590 Ohm
MDC IDC MSMT LEADCHNL RV SENSING INTR AMPL: 12 mV
MDC IDC PG SERIAL: 8904055
MDC IDC SESS DTM: 20180808134617
MDC IDC STAT BRADY RV PERCENT PACED: 4.1 %
Pulse Gen Model: 2272

## 2017-10-28 ENCOUNTER — Telehealth: Payer: Self-pay | Admitting: Internal Medicine

## 2017-10-28 ENCOUNTER — Encounter: Payer: Medicare Other | Admitting: *Deleted

## 2017-10-28 NOTE — Telephone Encounter (Signed)
Frank Holloway is calling because he is needing a referral sent [place] (Dr. Palma Holteravid Bonnema) please fax to [phone], Office ph# [phone]. Please call if you have any questions

## 2017-10-30 NOTE — Telephone Encounter (Signed)
Spoke with patient. He is aware I need signed release completed in order to send records to his new Cardiologist. I emailed patient release of information to [email or web address]. Patient is aware once I receive this back his records will be forwarded to [place].

## 2017-11-01 ENCOUNTER — Encounter: Payer: Self-pay | Admitting: Cardiology

## 2018-11-28 IMAGING — CT CT HEAD W/O CM
4 series · 16 of 47 positions shown, 18 images · non-contrast
Comparison: None.

CLINICAL DATA: Unwitnessed fall.  Initial encounter.

EXAM:
CT HEAD WITHOUT CONTRAST
TECHNIQUE: Contiguous axial images were obtained from the base of the skull
through the vertex without intravenous contrast.

[Series 3: head without · axial · non-contrast · 0.43mm/px · z∈[-216,-96]mm · 7 of 33 slices shown, 9 images]
[im 5/33  brain]
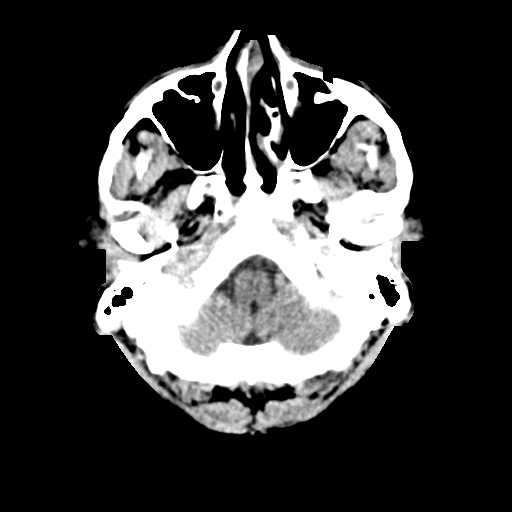
[im 5/33  bone]
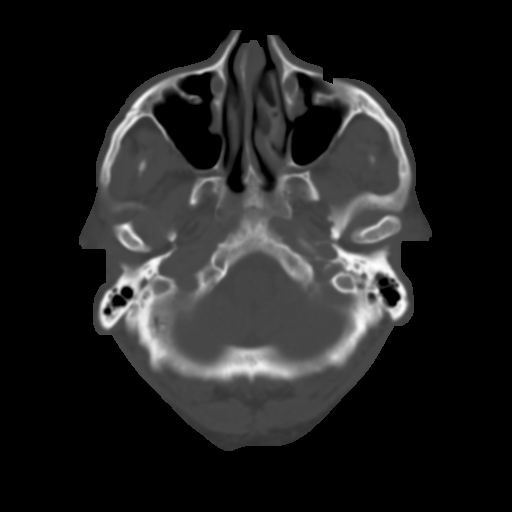
[im 9/33  brain]
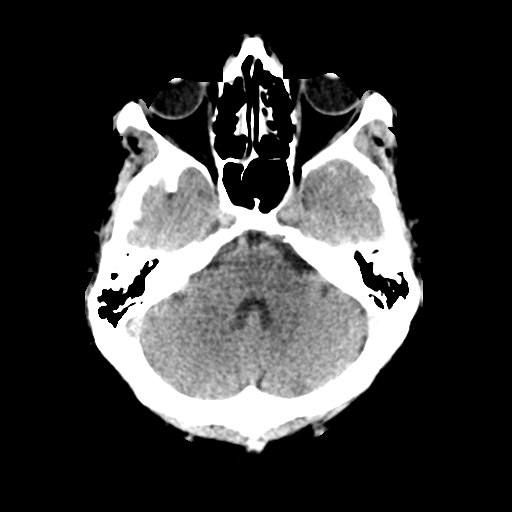
[im 13/33  brain]
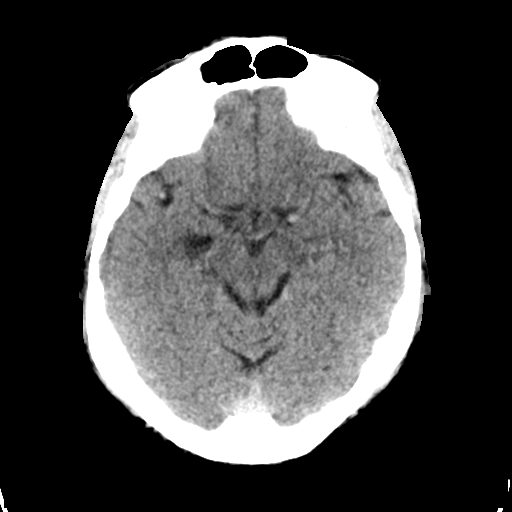
[im 17/33  brain]
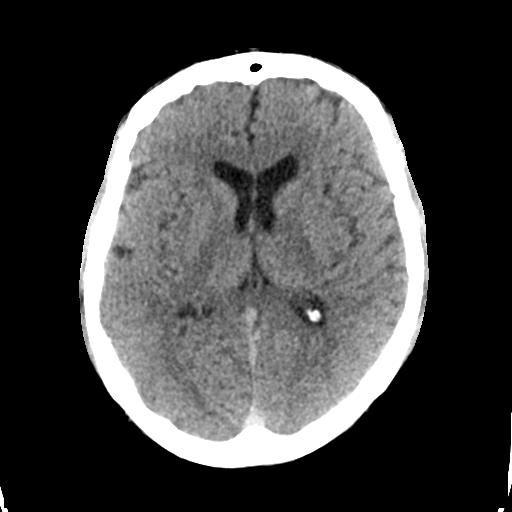
[im 21/33  brain]
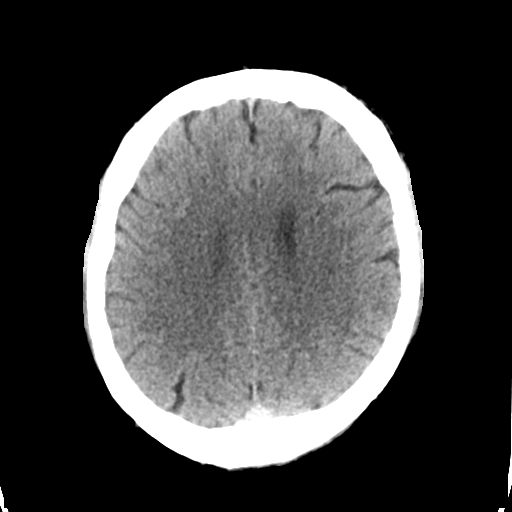
[im 21/33  bone]
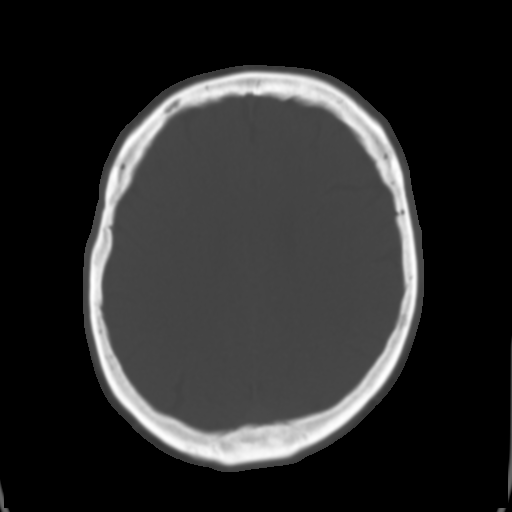
[im 25/33  brain]
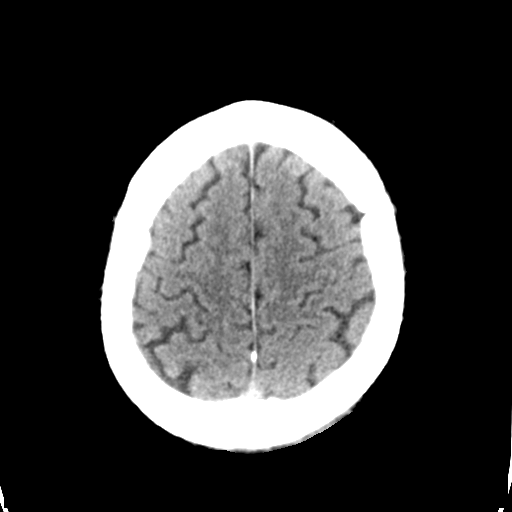
[im 29/33  brain]
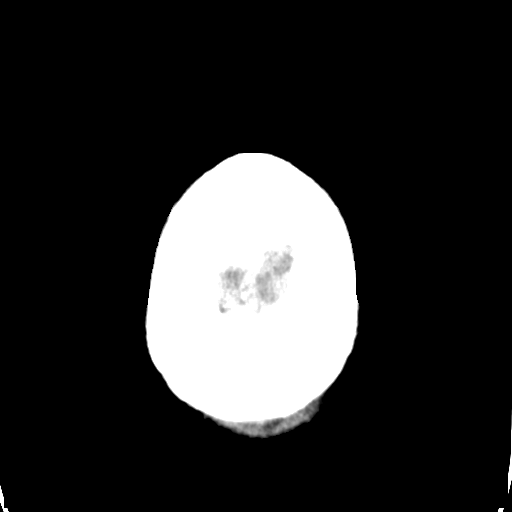

[Series 4: head bone · axial · 0.43mm/px · z∈[-220,-188]mm · 3 of 82 slices shown]
[im 9/82  bone]
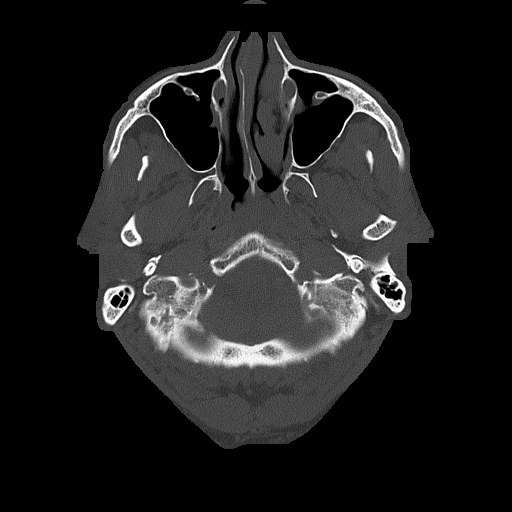
[im 17/82  bone]
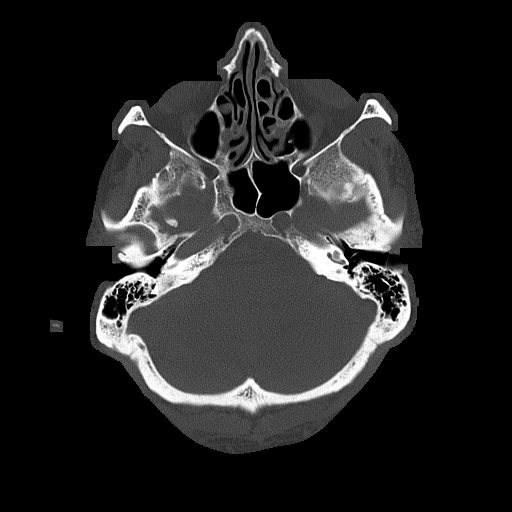
[im 25/82  bone]
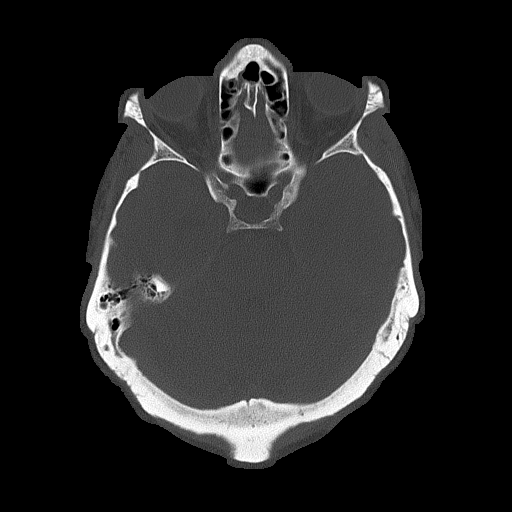

[Series 5: head without cor · coronal · non-contrast · 0.32mm/px · 3 of 69 slices shown]
[im 23/69  brain]
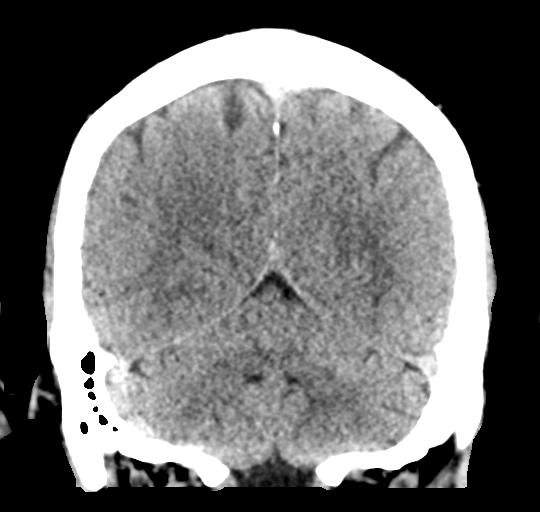
[im 31/69  brain]
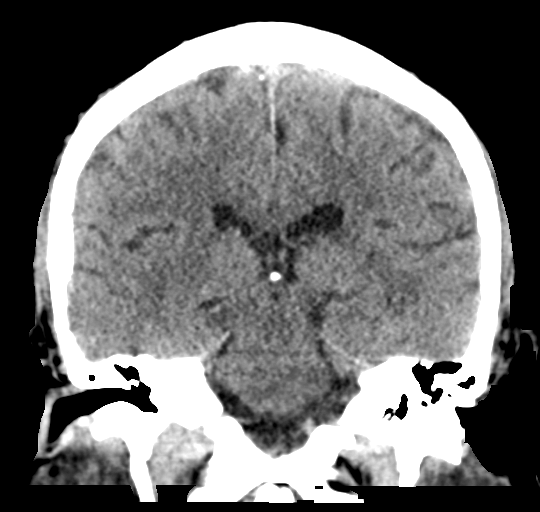
[im 38/69  brain]
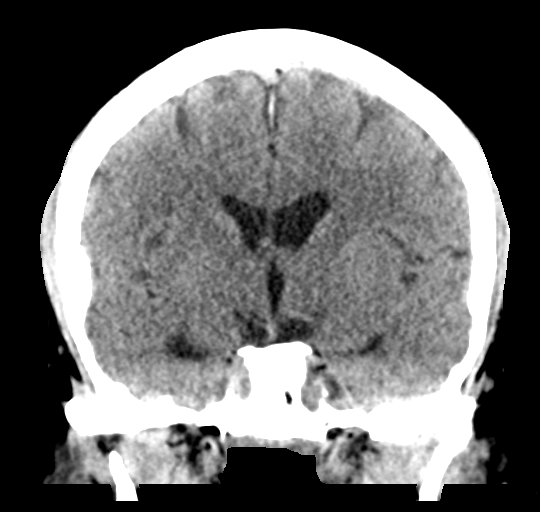

[Series 6: head without sag · sagittal · non-contrast · 0.32mm/px · 3 of 58 slices shown]
[im 20/58  brain]
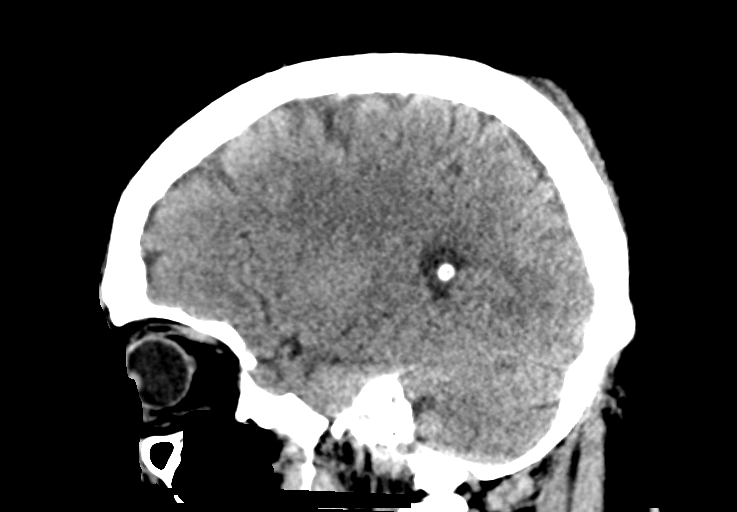
[im 29/58  brain]
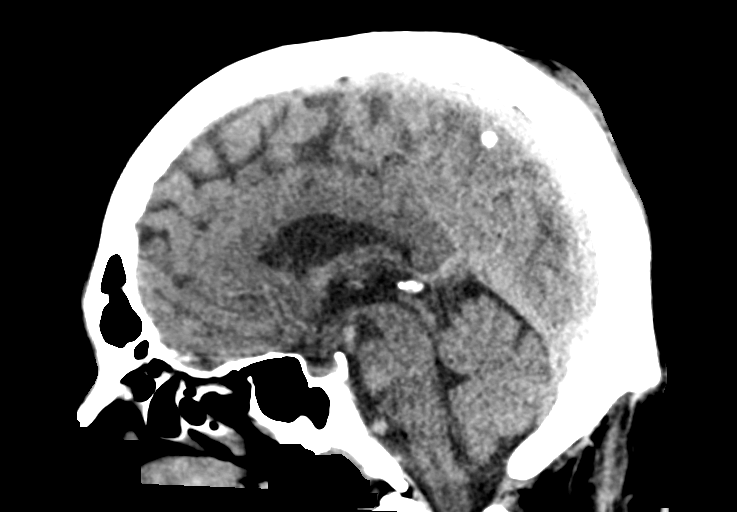
[im 39/58  brain]
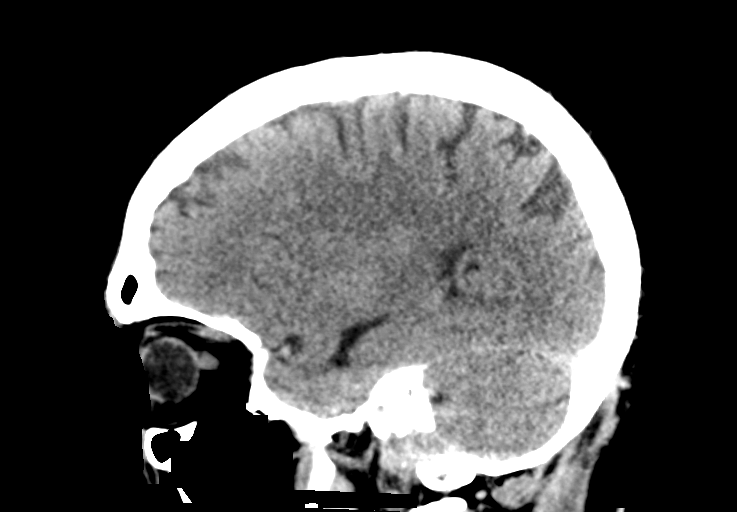

[16 of 47 positions shown; findings below may reference images not displayed]

FINDINGS: Brain: No swelling or acute hemorrhage suspected. Minimal thickened
appearance of the inferior septum pellucidum on [DATE] is suspected
volume averaging. No discrete hemorrhage at this site on reformats.
No infarct, hydrocephalus, or mass.

Vascular: Negative

Skull: Scalp contusion at the vertex. Negative for underlying
fracture

Sinuses/Orbits: Mild mucosal thickening in the paranasal sinuses. No
hemosinus.
IMPRESSION: Scalp contusion at the vertex without fracture. No acute
intracranial finding.
